# Patient Record
Sex: Female | Born: 1975 | Race: Black or African American | Hispanic: No | Smoking: Former smoker
Health system: Southern US, Community
[De-identification: ages and names within clinical notes are randomized; demographics above are authoritative.]

## PROBLEM LIST (undated history)

## (undated) DIAGNOSIS — I10 Essential (primary) hypertension: Secondary | ICD-10-CM

## (undated) DIAGNOSIS — G8929 Other chronic pain: Secondary | ICD-10-CM

## (undated) DIAGNOSIS — R51 Headache: Secondary | ICD-10-CM

## (undated) DIAGNOSIS — E7800 Pure hypercholesterolemia, unspecified: Secondary | ICD-10-CM

## (undated) DIAGNOSIS — R519 Headache, unspecified: Secondary | ICD-10-CM

## (undated) DIAGNOSIS — F32A Depression, unspecified: Secondary | ICD-10-CM

## (undated) DIAGNOSIS — F419 Anxiety disorder, unspecified: Secondary | ICD-10-CM

## (undated) DIAGNOSIS — K219 Gastro-esophageal reflux disease without esophagitis: Secondary | ICD-10-CM

## (undated) DIAGNOSIS — M549 Dorsalgia, unspecified: Secondary | ICD-10-CM

## (undated) HISTORY — PX: CHOLECYSTECTOMY: SHX55

## (undated) HISTORY — PX: TUBAL LIGATION: SHX77

---

## 2006-10-31 ENCOUNTER — Emergency Department (HOSPITAL_COMMUNITY): Admission: EM | Admit: 2006-10-31 | Discharge: 2006-10-31 | Payer: Self-pay | Admitting: Emergency Medicine

## 2009-01-22 ENCOUNTER — Emergency Department (HOSPITAL_COMMUNITY): Admission: EM | Admit: 2009-01-22 | Discharge: 2009-01-22 | Payer: Self-pay | Admitting: Emergency Medicine

## 2010-11-07 LAB — POCT CARDIAC MARKERS
CKMB, poc: <1 ng/mL — ABNORMAL LOW (ref 1.0–8.0)
Troponin i, poc: <0.05 ng/mL (ref 0.00–0.09)

## 2010-11-07 LAB — DIFFERENTIAL
Eosinophils Relative: 2 % (ref 0–5)
Lymphocytes Relative: 32 % (ref 12–46)
Lymphs Abs: 4.1 10*3/uL — ABNORMAL HIGH (ref 0.7–4.0)
Neutro Abs: 7.9 10*3/uL — ABNORMAL HIGH (ref 1.7–7.7)

## 2010-11-07 LAB — CBC
HCT: 39.7 % (ref 36.0–46.0)
Hemoglobin: 13.5 g/dL (ref 12.0–15.0)
Platelets: 187 10*3/uL (ref 150–400)
WBC: 12.8 10*3/uL — ABNORMAL HIGH (ref 4.0–10.5)

## 2010-11-07 LAB — BASIC METABOLIC PANEL
Calcium: 9.4 mg/dL (ref 8.4–10.5)
GFR calc non Af Amer: >60 mL/min (ref >60)
Potassium: 3.9 mEq/L (ref 3.5–5.1)
Sodium: 137 mEq/L (ref 135–145)

## 2010-11-07 LAB — D-DIMER, QUANTITATIVE: D-Dimer, Quant: 0.22 ug/mL-FEU (ref 0.00–0.48)

## 2011-01-15 ENCOUNTER — Emergency Department (HOSPITAL_COMMUNITY): Payer: Worker's Compensation

## 2011-01-15 ENCOUNTER — Emergency Department (HOSPITAL_COMMUNITY)
Admission: EM | Admit: 2011-01-15 | Discharge: 2011-01-15 | Disposition: A | Discharge status: Home / Self Care | Payer: Worker's Compensation | Attending: Emergency Medicine | Admitting: Emergency Medicine

## 2011-01-15 DIAGNOSIS — X500XXA Overexertion from strenuous movement or load, initial encounter: Secondary | ICD-10-CM | POA: Insufficient documentation

## 2011-01-15 DIAGNOSIS — F17200 Nicotine dependence, unspecified, uncomplicated: Secondary | ICD-10-CM | POA: Insufficient documentation

## 2011-01-15 DIAGNOSIS — Y9210 Unspecified residential institution as the place of occurrence of the external cause: Secondary | ICD-10-CM | POA: Insufficient documentation

## 2011-01-15 DIAGNOSIS — I10 Essential (primary) hypertension: Secondary | ICD-10-CM | POA: Insufficient documentation

## 2011-01-15 DIAGNOSIS — S335XXA Sprain of ligaments of lumbar spine, initial encounter: Secondary | ICD-10-CM | POA: Insufficient documentation

## 2011-02-10 ENCOUNTER — Other Ambulatory Visit (HOSPITAL_COMMUNITY): Payer: Self-pay | Admitting: Preventative Medicine

## 2011-02-10 DIAGNOSIS — M5410 Radiculopathy, site unspecified: Secondary | ICD-10-CM

## 2011-02-14 ENCOUNTER — Ambulatory Visit (HOSPITAL_COMMUNITY)
Admission: RE | Admit: 2011-02-14 | Discharge: 2011-02-14 | Disposition: A | Discharge status: Home / Self Care | Payer: Worker's Compensation | Source: Ambulatory Visit | Attending: Preventative Medicine | Admitting: Preventative Medicine

## 2011-02-14 DIAGNOSIS — M5410 Radiculopathy, site unspecified: Secondary | ICD-10-CM

## 2011-03-01 ENCOUNTER — Other Ambulatory Visit: Payer: Self-pay | Admitting: Preventative Medicine

## 2011-03-01 DIAGNOSIS — IMO0002 Reserved for concepts with insufficient information to code with codable children: Secondary | ICD-10-CM

## 2011-03-02 ENCOUNTER — Other Ambulatory Visit: Payer: Self-pay | Admitting: Preventative Medicine

## 2011-03-02 ENCOUNTER — Ambulatory Visit
Admission: RE | Admit: 2011-03-02 | Discharge: 2011-03-02 | Disposition: A | Discharge status: Home / Self Care | Payer: Worker's Compensation | Source: Ambulatory Visit | Attending: Preventative Medicine | Admitting: Preventative Medicine

## 2011-03-02 DIAGNOSIS — M5126 Other intervertebral disc displacement, lumbar region: Secondary | ICD-10-CM

## 2011-03-02 DIAGNOSIS — IMO0002 Reserved for concepts with insufficient information to code with codable children: Secondary | ICD-10-CM

## 2011-03-02 DIAGNOSIS — M48061 Spinal stenosis, lumbar region without neurogenic claudication: Secondary | ICD-10-CM

## 2011-03-02 MED ORDER — METHYLPREDNISOLONE ACETATE 40 MG/ML INJ SUSP (RADIOLOG
120.0000 mg | Freq: Once | INTRAMUSCULAR | Status: AC
  Administered 2011-03-02: 120 mg via EPIDURAL

## 2011-03-02 MED ORDER — IOHEXOL 180 MG/ML  SOLN
1.0000 mL | Freq: Once | INTRAMUSCULAR | Status: AC | PRN
  Administered 2011-03-02: 1 mL via EPIDURAL

## 2011-03-16 ENCOUNTER — Other Ambulatory Visit: Payer: Self-pay | Admitting: Preventative Medicine

## 2011-03-16 ENCOUNTER — Ambulatory Visit
Admission: RE | Admit: 2011-03-16 | Discharge: 2011-03-16 | Disposition: A | Discharge status: Home / Self Care | Payer: Worker's Compensation | Source: Ambulatory Visit | Attending: Preventative Medicine | Admitting: Preventative Medicine

## 2011-03-16 DIAGNOSIS — M48061 Spinal stenosis, lumbar region without neurogenic claudication: Secondary | ICD-10-CM

## 2011-03-16 DIAGNOSIS — R1030 Lower abdominal pain, unspecified: Secondary | ICD-10-CM

## 2011-03-16 DIAGNOSIS — M5126 Other intervertebral disc displacement, lumbar region: Secondary | ICD-10-CM

## 2011-03-16 MED ORDER — METHYLPREDNISOLONE ACETATE 40 MG/ML INJ SUSP (RADIOLOG
120.0000 mg | Freq: Once | INTRAMUSCULAR | Status: AC
  Administered 2011-03-16: 120 mg via EPIDURAL

## 2011-03-16 MED ORDER — IOHEXOL 180 MG/ML  SOLN
1.0000 mL | Freq: Once | INTRAMUSCULAR | Status: AC | PRN
  Administered 2011-03-16: 1 mL via EPIDURAL

## 2011-03-30 ENCOUNTER — Ambulatory Visit
Admission: RE | Admit: 2011-03-30 | Discharge: 2011-03-30 | Disposition: A | Discharge status: Home / Self Care | Payer: Worker's Compensation | Source: Ambulatory Visit | Attending: Preventative Medicine | Admitting: Preventative Medicine

## 2011-03-30 DIAGNOSIS — R1030 Lower abdominal pain, unspecified: Secondary | ICD-10-CM

## 2012-08-19 ENCOUNTER — Emergency Department (HOSPITAL_COMMUNITY): Payer: PRIVATE HEALTH INSURANCE

## 2012-08-19 ENCOUNTER — Encounter (HOSPITAL_COMMUNITY): Payer: Self-pay | Admitting: *Deleted

## 2012-08-19 ENCOUNTER — Emergency Department (HOSPITAL_COMMUNITY)
Admission: EM | Admit: 2012-08-19 | Discharge: 2012-08-19 | Disposition: A | Discharge status: Home / Self Care | Payer: PRIVATE HEALTH INSURANCE | Attending: Emergency Medicine | Admitting: Emergency Medicine

## 2012-08-19 ENCOUNTER — Other Ambulatory Visit: Payer: Self-pay

## 2012-08-19 DIAGNOSIS — R209 Unspecified disturbances of skin sensation: Secondary | ICD-10-CM | POA: Insufficient documentation

## 2012-08-19 DIAGNOSIS — R42 Dizziness and giddiness: Secondary | ICD-10-CM | POA: Insufficient documentation

## 2012-08-19 DIAGNOSIS — I10 Essential (primary) hypertension: Secondary | ICD-10-CM | POA: Insufficient documentation

## 2012-08-19 DIAGNOSIS — F17200 Nicotine dependence, unspecified, uncomplicated: Secondary | ICD-10-CM | POA: Insufficient documentation

## 2012-08-19 DIAGNOSIS — E7800 Pure hypercholesterolemia, unspecified: Secondary | ICD-10-CM | POA: Insufficient documentation

## 2012-08-19 DIAGNOSIS — R002 Palpitations: Secondary | ICD-10-CM | POA: Insufficient documentation

## 2012-08-19 HISTORY — DX: Pure hypercholesterolemia, unspecified: E78.00

## 2012-08-19 HISTORY — DX: Essential (primary) hypertension: I10

## 2012-08-19 LAB — COMPREHENSIVE METABOLIC PANEL
ALT: 15 U/L (ref 0–35)
AST: 17 U/L (ref 0–37)
Albumin: 4.4 g/dL (ref 3.5–5.2)
Alkaline Phosphatase: 95 U/L (ref 39–117)
Chloride: 98 mEq/L (ref 96–112)
Potassium: 2.9 mEq/L — ABNORMAL LOW (ref 3.5–5.1)
Sodium: 139 mEq/L (ref 135–145)
Total Bilirubin: 0.5 mg/dL (ref 0.3–1.2)
Total Protein: 7.6 g/dL (ref 6.0–8.3)

## 2012-08-19 LAB — CBC WITH DIFFERENTIAL/PLATELET
Basophils Absolute: 0 10*3/uL (ref 0.0–0.1)
Basophils Relative: 0 % (ref 0–1)
Eosinophils Absolute: 0.2 10*3/uL (ref 0.0–0.7)
Hemoglobin: 13.4 g/dL (ref 12.0–15.0)
MCH: 27.3 pg (ref 26.0–34.0)
MCHC: 33.8 g/dL (ref 30.0–36.0)
Monocytes Relative: 5 % (ref 3–12)
Neutro Abs: 5.9 10*3/uL (ref 1.7–7.7)
Neutrophils Relative %: 58 % (ref 43–77)
Platelets: 224 10*3/uL (ref 150–400)
RDW: 13.6 % (ref 11.5–15.5)

## 2012-08-19 LAB — D-DIMER, QUANTITATIVE: D-Dimer, Quant: <0.27 ug/mL-FEU (ref 0.00–0.48)

## 2012-08-19 MED ORDER — POTASSIUM CHLORIDE 20 MEQ/15ML (10%) PO LIQD
40.0000 meq | Freq: Once | ORAL | Status: AC
  Administered 2012-08-19: 40 meq via ORAL
  Filled 2012-08-19: qty 30

## 2012-08-19 MED ORDER — POTASSIUM CHLORIDE CRYS ER 20 MEQ PO TBCR
40.0000 meq | EXTENDED_RELEASE_TABLET | Freq: Once | ORAL | Status: DC
  Filled 2012-08-19: qty 2

## 2012-08-19 MED ORDER — SODIUM CHLORIDE 0.9 % IV SOLN
INTRAVENOUS | Status: DC
  Administered 2012-08-19: 21:00:00 via INTRAVENOUS

## 2012-08-19 NOTE — ED Notes (Signed)
Sudden onset dizziness and shakey  , "numb all over".

## 2012-08-19 NOTE — ED Notes (Signed)
Pt c/o tachycardia and states her "heart was racing" earlier today. Pt states she became weak when symptoms began and had to lay down. Pt states she's had these symptoms previously but has never been seen by MD. Pt denies chest pain.

## 2012-08-19 NOTE — ED Provider Notes (Signed)
History   This chart was scribed for Toy Baker, MD by Charolett Bumpers, ED Scribe. The patient was seen in room [facility]08/[facility]08. Patient's care was started at 2000.   CSN: [id number]  Arrival date & time 08/19/12  7829   First MD Initiated Contact with Patient 08/19/12 2000      Chief Complaint  Patient presents with  . Tachycardia    The history is provided by the patient. No language interpreter was used.   Shirley Thompson is a 37 y.o. female who presents to the Emergency Department complaining of sudden onset of palpitations that started earlier today after waking around 2 pm. She reports associated generalized shakiness, dizziness and facial numbness. She states that the first episode last for 10 minutes. She laid down with improvement of her symptoms, but states the symptoms returned with sitting up. She states that she felt fine this morning after getting off work. She denies any recent illnesses. She denies any vomiting, diarrhea, fever, cough, congestion, SOB, chest pain, abdominal pain, leg swelling or pain. She is currently on her menstrual cycle and denies any chance of pregnancy. She denies any h/o thyroid problems. She states that she has had similar symptoms in the past but has not been evaluated prior to today.   Past Medical History  Diagnosis Date  . Hypertension   . Hypercholesteremia     Past Surgical History  Procedure Date  . Tubal ligation     History reviewed. No pertinent family history.  History  Substance Use Topics  . Smoking status: Current Every Day Smoker    Types: Cigarettes  . Smokeless tobacco: Not on file  . Alcohol Use: No    OB History    Grav Para Term Preterm Abortions TAB SAB Ect Mult Living                  Review of Systems  Constitutional: Negative for fever.  HENT: Negative for congestion.   Respiratory: Negative for cough and shortness of breath.   Cardiovascular: Positive for palpitations. Negative for chest pain and  leg swelling.  Gastrointestinal: Negative for vomiting, abdominal pain and diarrhea.  Neurological: Positive for dizziness and numbness.  All other systems reviewed and are negative.    Allergies  Sulfa antibiotics  Home Medications  No current outpatient prescriptions on file.  BP 126/106  Pulse 66  Temp 97.9 F (36.6 C) (Oral)  Resp 20  Ht 5\' 4"  (1.626 m)  Wt 163 lb 3 oz (74.021 kg)  BMI 28.01 kg/m2  SpO2 100%  LMP 08/13/2012  Physical Exam  Nursing note and vitals reviewed. Constitutional: She is oriented to person, place, and time. She appears well-developed and well-nourished.  Non-toxic appearance. No distress.  HENT:  Head: Normocephalic and atraumatic.  Eyes: Conjunctivae normal, EOM and lids are normal. Pupils are equal, round, and reactive to light.  Neck: Normal range of motion. Neck supple. No tracheal deviation present. No mass present.  Cardiovascular: Normal rate, regular rhythm and normal heart sounds.  Exam reveals no gallop.   No murmur heard. Pulmonary/Chest: Effort normal and breath sounds normal. No stridor. No respiratory distress. She has no decreased breath sounds. She has no wheezes. She has no rhonchi. She has no rales.  Abdominal: Soft. Normal appearance and bowel sounds are normal. She exhibits no distension. There is no tenderness. There is no rebound and no CVA tenderness.  Musculoskeletal: Normal range of motion. She exhibits no edema and no tenderness.  Neurological: She is alert and oriented to person, place, and time. She has normal strength. No cranial nerve deficit or sensory deficit. GCS eye subscore is 4. GCS verbal subscore is 5. GCS motor subscore is 6.       Normal gait. Normal finger to nose test.   Skin: Skin is warm and dry. No abrasion and no rash noted.  Psychiatric: She has a normal mood and affect. Her speech is normal and behavior is normal.    ED Course  Procedures (including critical care time)  DIAGNOSTIC  STUDIES: Oxygen Saturation is 100% on room air, normal by my interpretation.    COORDINATION OF CARE:  20:20-Discussed planned course of treatment with the patient including a chest x-ray and blood work, who is agreeable at this time.    Results for orders placed during the hospital encounter of 08/19/12  D-DIMER, QUANTITATIVE      Component Value Range   D-Dimer, Quant <0.27  0.00 - 0.48 ug/mL-FEU  CBC WITH DIFFERENTIAL      Component Value Range   WBC 10.4  4.0 - 10.5 K/uL   RBC 4.90  3.87 - 5.11 MIL/uL   Hemoglobin 13.4  12.0 - 15.0 g/dL   HCT 09.8  11.9 - 14.7 %   MCV 81.0  78.0 - 100.0 fL   MCH 27.3  26.0 - 34.0 pg   MCHC 33.8  30.0 - 36.0 g/dL   RDW 82.9  56.2 - 13.0 %   Platelets 224  150 - 400 K/uL   Neutrophils Relative 58  43 - 77 %   Neutro Abs 5.9  1.7 - 7.7 K/uL   Lymphocytes Relative 36  12 - 46 %   Lymphs Abs 3.8  0.7 - 4.0 K/uL   Monocytes Relative 5  3 - 12 %   Monocytes Absolute 0.5  0.1 - 1.0 K/uL   Eosinophils Relative 1  0 - 5 %   Eosinophils Absolute 0.2  0.0 - 0.7 K/uL   Basophils Relative 0  0 - 1 %   Basophils Absolute 0.0  0.0 - 0.1 K/uL  COMPREHENSIVE METABOLIC PANEL      Component Value Range   Sodium 139  135 - 145 mEq/L   Potassium 2.9 (*) 3.5 - 5.1 mEq/L   Chloride 98  96 - 112 mEq/L   CO2 31  19 - 32 mEq/L   Glucose, Bld 97  70 - 99 mg/dL   BUN 11  6 - 23 mg/dL   Creatinine, Ser 8.65  0.50 - 1.10 mg/dL   Calcium 9.7  8.4 - 78.4 mg/dL   Total Protein 7.6  6.0 - 8.3 g/dL   Albumin 4.4  3.5 - 5.2 g/dL   AST 17  0 - 37 U/L   ALT 15  0 - 35 U/L   Alkaline Phosphatase 95  39 - 117 U/L   Total Bilirubin 0.5  0.3 - 1.2 mg/dL   GFR calc non Af Amer 90 (*) >90 mL/min   GFR calc Af Amer >90  >90 mL/min    Dg Chest 2 View  08/19/2012  *RADIOLOGY REPORT*  Clinical Data: Chest pain, weakness and dizziness.  CHEST - 2 VIEW  Comparison: PA and lateral chest 12/24/2010.  Findings: Lungs are clear.  Heart size is normal.  No pneumothorax or pleural  fluid.  Thoracolumbar scoliosis noted.  IMPRESSION: No acute disease.   Original Report Authenticated By: Holley Dexter, M.D.      No diagnosis  found.    MDM   Date: 08/19/2012  Rate: 70  Rhythm: normal sinus rhythm  QRS Axis: normal  Intervals: normal  ST/T Wave abnormalities: normal  Conduction Disutrbances:none  Narrative Interpretation:   Old EKG Reviewed: unchanged Patient with mild hypokalemia that was treated with oral potassium. D-dimer negative. Chest x-ray normal. Thyroid function studies pending and she will followup with her Dr.    I personally performed the services described in this documentation, which was scribed in my presence. The recorded information has been reviewed and is accurate.        Toy Baker, MD 08/19/12 2206

## 2012-08-20 LAB — T4: T4, Total: 11.7 ug/dL (ref 5.0–12.5)

## 2012-12-23 ENCOUNTER — Emergency Department (HOSPITAL_COMMUNITY)
Admission: EM | Admit: 2012-12-23 | Discharge: 2012-12-23 | Disposition: A | Discharge status: Home / Self Care | Payer: PRIVATE HEALTH INSURANCE | Attending: Emergency Medicine | Admitting: Emergency Medicine

## 2012-12-23 ENCOUNTER — Encounter (HOSPITAL_COMMUNITY): Payer: Self-pay | Admitting: *Deleted

## 2012-12-23 DIAGNOSIS — R221 Localized swelling, mass and lump, neck: Secondary | ICD-10-CM | POA: Insufficient documentation

## 2012-12-23 DIAGNOSIS — F17200 Nicotine dependence, unspecified, uncomplicated: Secondary | ICD-10-CM | POA: Insufficient documentation

## 2012-12-23 DIAGNOSIS — I10 Essential (primary) hypertension: Secondary | ICD-10-CM | POA: Insufficient documentation

## 2012-12-23 DIAGNOSIS — R51 Headache: Secondary | ICD-10-CM | POA: Insufficient documentation

## 2012-12-23 DIAGNOSIS — Z862 Personal history of diseases of the blood and blood-forming organs and certain disorders involving the immune mechanism: Secondary | ICD-10-CM | POA: Insufficient documentation

## 2012-12-23 DIAGNOSIS — K0889 Other specified disorders of teeth and supporting structures: Secondary | ICD-10-CM

## 2012-12-23 DIAGNOSIS — R220 Localized swelling, mass and lump, head: Secondary | ICD-10-CM | POA: Insufficient documentation

## 2012-12-23 DIAGNOSIS — K089 Disorder of teeth and supporting structures, unspecified: Secondary | ICD-10-CM | POA: Insufficient documentation

## 2012-12-23 DIAGNOSIS — Z79899 Other long term (current) drug therapy: Secondary | ICD-10-CM | POA: Insufficient documentation

## 2012-12-23 DIAGNOSIS — Z8639 Personal history of other endocrine, nutritional and metabolic disease: Secondary | ICD-10-CM | POA: Insufficient documentation

## 2012-12-23 MED ORDER — ACETAMINOPHEN-CODEINE 120-12 MG/5ML PO SUSP: 10.0000 mL | Freq: Four times a day (QID) | ORAL | Status: DC | PRN

## 2012-12-23 MED ORDER — AMOXICILLIN 400 MG/5ML PO SUSR: 400.0000 mg | Freq: Three times a day (TID) | ORAL | Status: AC

## 2012-12-23 MED ORDER — IBUPROFEN 100 MG/5ML PO SUSP
600.0000 mg | Freq: Once | ORAL | Status: AC
  Administered 2012-12-23: 600 mg via ORAL
  Filled 2012-12-23: qty 30

## 2012-12-23 MED ORDER — AMOXICILLIN 250 MG/5ML PO SUSR
500.0000 mg | Freq: Once | ORAL | Status: AC
  Administered 2012-12-23: 500 mg via ORAL
  Filled 2012-12-23: qty 10

## 2012-12-23 MED ORDER — IBUPROFEN 100 MG/5ML PO SUSP
ORAL | Status: AC
  Filled 2012-12-23: qty 30

## 2012-12-23 NOTE — ED Notes (Signed)
Dental pain x 2 days

## 2012-12-23 NOTE — ED Notes (Signed)
Pain rt jaw, says she has dental pain,  Seems very uncomfortable.

## 2012-12-23 NOTE — ED Provider Notes (Signed)
History     CSN: [id number]  Arrival date & time 12/23/12  2058   First MD Initiated Contact with Patient 12/23/12 2137      Chief Complaint  Patient presents with  . Dental Pain    (Consider location/radiation/quality/duration/timing/severity/associated sxs/prior treatment) Patient is a 37 y.o. female presenting with tooth pain. The history is provided by the patient.  Dental Pain Location:  Lower Quality:  Throbbing and radiating Severity:  Severe Onset quality:  Gradual Duration:  2 days Timing:  Intermittent Progression:  Worsening Chronicity:  New Context comment:  Pain of the teeth of the right lower jaw. Relieved by:  Nothing Worsened by:  Nothing tried Ineffective treatments: BC powders. Associated symptoms: facial pain and gum swelling   Associated symptoms: no difficulty swallowing, no fever and no neck pain   Risk factors: smoking     Past Medical History  Diagnosis Date  . Hypertension   . Hypercholesteremia     Past Surgical History  Procedure Laterality Date  . Tubal ligation      No family history on file.  History  Substance Use Topics  . Smoking status: Current Every Day Smoker    Types: Cigarettes  . Smokeless tobacco: Not on file  . Alcohol Use: No    OB History   Grav Para Term Preterm Abortions TAB SAB Ect Mult Living                  Review of Systems  Constitutional: Negative for fever and activity change.       All ROS Neg except as noted in HPI  HENT: Positive for dental problem. Negative for nosebleeds and neck pain.   Eyes: Negative for photophobia and discharge.  Respiratory: Negative for cough, shortness of breath and wheezing.   Cardiovascular: Negative for chest pain and palpitations.  Gastrointestinal: Negative for abdominal pain and blood in stool.  Genitourinary: Negative for dysuria, frequency and hematuria.  Musculoskeletal: Negative for back pain and arthralgias.  Skin: Negative.   Neurological: Negative for  dizziness, seizures and speech difficulty.  Psychiatric/Behavioral: Negative for hallucinations and confusion.    Allergies  Sulfa antibiotics  Home Medications   Current Outpatient Rx  Name  Route  Sig  Dispense  Refill  . Aspirin-Salicylamide-Caffeine (BC HEADACHE) 325-95-16 MG TABS   Oral   Take 1 packet by mouth daily as needed. Once taken as needed for pain         . hydrochlorothiazide (HYDRODIURIL) 25 MG tablet   Oral   Take 25 mg by mouth every morning.         Marland Kitchen acetaminophen-codeine (CAPITAL/CODEINE) 120-12 MG/5ML suspension   Oral   Take 10 mLs by mouth every 6 (six) hours as needed for pain.   120 mL   0   . amoxicillin (AMOXIL) 400 MG/5ML suspension   Oral   Take 5 mLs (400 mg total) by mouth 3 (three) times daily.   100 mL   0     BP 139/93  Pulse 86  Temp(Src) 97.5 F (36.4 C) (Oral)  Resp 20  Ht 5\' 4"  (1.626 m)  Wt 157 lb 4.8 oz (71.351 kg)  BMI 26.99 kg/m2  SpO2 100%  LMP 12/16/2012  Physical Exam  Nursing note and vitals reviewed. Constitutional: She is oriented to person, place, and time. She appears well-developed and well-nourished.  Non-toxic appearance.  HENT:  Head: Normocephalic.  Right Ear: Tympanic membrane and external ear normal.  Left Ear: Tympanic membrane  and external ear normal.  There is pain to palpation of the teeth entire right lower jaw. No visible abscess appreciated. There appears to be an impacted wisdom tooth present. The airway is patent. There is no swelling under the tongue.  Eyes: EOM and lids are normal. Pupils are equal, round, and reactive to light.  Neck: Normal range of motion. Neck supple. Carotid bruit is not present.  Cardiovascular: Normal rate, regular rhythm, normal heart sounds, intact distal pulses and normal pulses.   Pulmonary/Chest: Breath sounds normal. No respiratory distress.  Abdominal: Soft. Bowel sounds are normal. There is no tenderness. There is no guarding.  Musculoskeletal: Normal  range of motion.  Lymphadenopathy:       Head (right side): No submandibular adenopathy present.       Head (left side): No submandibular adenopathy present.    She has no cervical adenopathy.  Neurological: She is alert and oriented to person, place, and time. She has normal strength. No cranial nerve deficit or sensory deficit.  Skin: Skin is warm and dry.  Psychiatric: She has a normal mood and affect. Her speech is normal.    ED Course  Dental Date/Time: 12/23/2012 10:30 PM Performed by: Kathie Dike Authorized by: Kathie Dike Consent: Verbal consent obtained. Risks and benefits: risks, benefits and alternatives were discussed Consent given by: patient Patient understanding: patient states understanding of the procedure being performed Time out: Immediately prior to procedure a "time out" was called to verify the correct patient, procedure, equipment, support staff and site/side marked as required. Local anesthesia used: yes Anesthesia method: dental block. Local anesthetic: bupivacaine 0.25% without epinephrine Patient sedated: no Patient tolerance: Patient tolerated the procedure well with no immediate complications. Comments: Pain much improved after dental block.   (including critical care time)  Labs Reviewed - No data to display No results found.   1. Toothache       MDM  I have reviewed nursing notes, vital signs, and all appropriate lab and imaging results for this patient. Patient presents to the emergency department with right lower jaw dental pain. There is no visible abscess appreciated. The airway is patent. There is no swelling under the tongue.  Patient is treated with a dental block using 0.25% plain bupivacaine.  Prescription for Amoxil suspension and acetaminophen codeine suspension given to the patient. Patient also given a Optician, dispensing. Pain much improved after the dental block.       Kathie Dike, PA-C 12/23/12 2236

## 2012-12-24 NOTE — ED Provider Notes (Signed)
Medical screening examination/treatment/procedure(s) were performed by non-physician practitioner and as supervising physician I was immediately available for consultation/collaboration.   Loren Racer, MD 12/24/12 [phone]

## 2013-08-16 ENCOUNTER — Emergency Department (HOSPITAL_COMMUNITY)
Admission: EM | Admit: 2013-08-16 | Discharge: 2013-08-16 | Disposition: A | Discharge status: Home / Self Care | Payer: PRIVATE HEALTH INSURANCE | Attending: Emergency Medicine | Admitting: Emergency Medicine

## 2013-08-16 ENCOUNTER — Emergency Department (HOSPITAL_COMMUNITY): Payer: PRIVATE HEALTH INSURANCE

## 2013-08-16 ENCOUNTER — Encounter (HOSPITAL_COMMUNITY): Payer: Self-pay | Admitting: Emergency Medicine

## 2013-08-16 DIAGNOSIS — I10 Essential (primary) hypertension: Secondary | ICD-10-CM | POA: Insufficient documentation

## 2013-08-16 DIAGNOSIS — Z862 Personal history of diseases of the blood and blood-forming organs and certain disorders involving the immune mechanism: Secondary | ICD-10-CM | POA: Insufficient documentation

## 2013-08-16 DIAGNOSIS — R110 Nausea: Secondary | ICD-10-CM | POA: Insufficient documentation

## 2013-08-16 DIAGNOSIS — Z8639 Personal history of other endocrine, nutritional and metabolic disease: Secondary | ICD-10-CM | POA: Insufficient documentation

## 2013-08-16 DIAGNOSIS — R1110 Vomiting, unspecified: Secondary | ICD-10-CM | POA: Insufficient documentation

## 2013-08-16 DIAGNOSIS — B9789 Other viral agents as the cause of diseases classified elsewhere: Secondary | ICD-10-CM

## 2013-08-16 DIAGNOSIS — R000 Tachycardia, unspecified: Secondary | ICD-10-CM | POA: Insufficient documentation

## 2013-08-16 DIAGNOSIS — J988 Other specified respiratory disorders: Secondary | ICD-10-CM | POA: Insufficient documentation

## 2013-08-16 DIAGNOSIS — F17200 Nicotine dependence, unspecified, uncomplicated: Secondary | ICD-10-CM | POA: Insufficient documentation

## 2013-08-16 MED ORDER — OSELTAMIVIR PHOSPHATE 75 MG PO CAPS: 75.0000 mg | ORAL_CAPSULE | Freq: Two times a day (BID) | ORAL | Status: DC

## 2013-08-16 MED ORDER — IBUPROFEN 800 MG PO TABS: 800.0000 mg | ORAL_TABLET | Freq: Three times a day (TID) | ORAL | Status: DC

## 2013-08-16 MED ORDER — ALBUTEROL SULFATE (2.5 MG/3ML) 0.083% IN NEBU
5.0000 mg | INHALATION_SOLUTION | Freq: Once | RESPIRATORY_TRACT | Status: AC
  Administered 2013-08-16: 5 mg via RESPIRATORY_TRACT
  Filled 2013-08-16: qty 6

## 2013-08-16 MED ORDER — SODIUM CHLORIDE 0.9 % IV BOLUS (SEPSIS)
1000.0000 mL | Freq: Once | INTRAVENOUS | Status: AC
  Administered 2013-08-16: 1000 mL via INTRAVENOUS

## 2013-08-16 MED ORDER — KETOROLAC TROMETHAMINE 30 MG/ML IJ SOLN
30.0000 mg | Freq: Once | INTRAMUSCULAR | Status: AC
  Administered 2013-08-16: 30 mg via INTRAVENOUS
  Filled 2013-08-16: qty 1

## 2013-08-16 MED ORDER — IBUPROFEN 800 MG PO TABS: 800.0000 mg | ORAL_TABLET | Freq: Once | ORAL | Status: DC

## 2013-08-16 MED ORDER — ONDANSETRON HCL 4 MG PO TABS: 4.0000 mg | ORAL_TABLET | Freq: Four times a day (QID) | ORAL | Status: DC

## 2013-08-16 MED ORDER — ONDANSETRON HCL 4 MG/2ML IJ SOLN
4.0000 mg | Freq: Once | INTRAMUSCULAR | Status: AC
  Administered 2013-08-16: 4 mg via INTRAVENOUS
  Filled 2013-08-16: qty 2

## 2013-08-16 MED ORDER — OSELTAMIVIR PHOSPHATE 75 MG PO CAPS
75.0000 mg | ORAL_CAPSULE | Freq: Once | ORAL | Status: AC
  Administered 2013-08-16: 75 mg via ORAL
  Filled 2013-08-16: qty 1

## 2013-08-16 MED ORDER — ALBUTEROL SULFATE HFA 108 (90 BASE) MCG/ACT IN AERS
2.0000 | INHALATION_SPRAY | RESPIRATORY_TRACT | Status: DC | PRN
  Administered 2013-08-16: 2 via RESPIRATORY_TRACT
  Filled 2013-08-16: qty 6.7

## 2013-08-16 MED ORDER — ACETAMINOPHEN 325 MG PO TABS
650.0000 mg | ORAL_TABLET | Freq: Once | ORAL | Status: AC
  Administered 2013-08-16: 650 mg via ORAL
  Filled 2013-08-16: qty 2

## 2013-08-16 NOTE — ED Notes (Signed)
Fever and body aches for 2 days, severe cough and now with chest discomfort with coughing

## 2013-08-16 NOTE — ED Notes (Signed)
Patient with no complaints at this time. Respirations even and unlabored. Skin warm/dry. Discharge instructions reviewed with patient at this time. Patient given opportunity to voice concerns/ask questions. Patient discharged at this time and left Emergency Department with steady gait.   

## 2013-08-16 NOTE — ED Provider Notes (Signed)
CSN: [id number]     Arrival date & time 08/16/13  0229 History   First MD Initiated Contact with Patient 08/16/13 0309     Chief Complaint  Patient presents with  . Fever  . Generalized Body Aches   (Consider location/radiation/quality/duration/timing/severity/associated sxs/prior Treatment) HPI History provided by patient. Fever, cough, body aches, sore throat, sharp chest pains only with coughing. Feeling sick since yesterday but symptoms became more severe tonight. No productive cough. No abdominal pain. Did have a bout of emesis tonight. Mild nausea at this time. No blood in emesis. No blood in stools. No known sick contacts. Symptoms moderate in severity. Past Medical History  Diagnosis Date  . Hypertension   . Hypercholesteremia    Past Surgical History  Procedure Laterality Date  . Tubal ligation     No family history on file. History  Substance Use Topics  . Smoking status: Current Every Day Smoker    Types: Cigarettes  . Smokeless tobacco: Not on file  . Alcohol Use: No   OB History   Grav Para Term Preterm Abortions TAB SAB Ect Mult Living                 Review of Systems  Constitutional: Positive for fever and chills.  HENT: Positive for congestion and sore throat.   Eyes: Negative for visual disturbance.  Respiratory: Positive for shortness of breath.   Cardiovascular: Positive for chest pain.  Gastrointestinal: Positive for vomiting. Negative for abdominal pain.  Genitourinary: Negative for dysuria.  Musculoskeletal: Negative for neck pain and neck stiffness.  Skin: Negative for rash.  Neurological: Negative for syncope.  All other systems reviewed and are negative.    Allergies  Sulfa antibiotics  Home Medications   Current Outpatient Rx  Name  Route  Sig  Dispense  Refill  . Aspirin-Salicylamide-Caffeine (BC HEADACHE) 325-95-16 MG TABS   Oral   Take 1 packet by mouth daily as needed. Once taken as needed for pain         .  hydrochlorothiazide (HYDRODIURIL) 25 MG tablet   Oral   Take 25 mg by mouth every morning.         Marland Kitchen. ibuprofen (ADVIL,MOTRIN) 100 MG/5ML suspension   Oral   Take 200 mg by mouth every 4 (four) hours as needed.         Marland Kitchen. acetaminophen-codeine (CAPITAL/CODEINE) 120-12 MG/5ML suspension   Oral   Take 10 mLs by mouth every 6 (six) hours as needed for pain.   120 mL   0    BP 134/79  Pulse 111  Temp(Src) 98.2 F (36.8 C) (Oral)  Resp 20  Ht 5\' 4"  (1.626 m)  Wt 170 lb (77.111 kg)  BMI 29.17 kg/m2  SpO2 96%  LMP 08/07/2013 Physical Exam  Constitutional: She is oriented to person, place, and time. She appears well-developed and well-nourished.  HENT:  Head: Normocephalic and atraumatic.  Mouth/Throat: Oropharynx is clear and moist. No oropharyngeal exudate.  Eyes: EOM are normal. Pupils are equal, round, and reactive to light. No scleral icterus.  Neck: Neck supple.  Cardiovascular: Regular rhythm and intact distal pulses.   Tachycardic  Pulmonary/Chest: Effort normal and breath sounds normal. No stridor. No respiratory distress. She has no wheezes. She has no rales.  Abdominal: Soft. Bowel sounds are normal. She exhibits no distension. There is no tenderness.  Musculoskeletal: Normal range of motion. She exhibits no edema and no tenderness.  Neurological: She is alert and oriented to person, place, and  time. No cranial nerve deficit.  Skin: Skin is warm and dry. No rash noted.    ED Course  Procedures (including critical care time) Labs Review Labs Reviewed - No data to display Imaging Review Dg Chest 2 View  08/16/2013   CLINICAL DATA:  Fever and body aches for 2 days. Severe cough and chest discomfort.  EXAM: CHEST  2 VIEW  COMPARISON:  Chest radiograph from 08/14/2013  FINDINGS: The lungs are well-aerated and clear. There is no evidence of focal opacification, pleural effusion or pneumothorax.  The heart is normal in size; the mediastinal contour is within normal  limits. No acute osseous abnormalities are seen.  IMPRESSION: No acute cardiopulmonary process seen.   Electronically Signed   By: Roanna Raider M.D.   On: 08/16/2013 03:33    Treated with IV fluids, IV Zofran, IV Toradol Albuterol nebulized treatment provided  On recheck, nebulizer help her breathing is feeling better after medications and fluids.  Plan discharge home with Tamiflu, albuterol inhaler, Zofran as needed. Patient will take Tylenol and Motrin. Flu precautions provided and verbalized is understood.  MDM  Diagnosis: Viral infection, flulike symptoms  Evaluated with chest x-ray reviewed as above. Improved with medications provided. Serial evaluations performed. Vital signs nurse's notes reviewed and considered   Sunnie Nielsen, MD 08/16/13 [phone]

## 2013-08-16 NOTE — ED Notes (Signed)
Patient w/fever, chills, cough x 2 days. Daughter had flu last week  [facility] Given IVF, Tylenol and Ibuprofen.  Had chest xray done and states they told her "there was something on the right side".  Lungs clear bilaterally throughout.  Last Ibuprofen 20 minutes ago.  Had episode of vomiting.  Denies diarrhea.

## 2013-08-16 NOTE — Discharge Instructions (Signed)
Rest, drink plenty of fluids, take Zofran and Motrin as needed. Use albuterol inhaler as needed. Return to the emergency department for any worsening condition.

## 2014-02-07 ENCOUNTER — Encounter (HOSPITAL_COMMUNITY): Payer: Self-pay | Admitting: Emergency Medicine

## 2014-02-07 ENCOUNTER — Emergency Department (HOSPITAL_COMMUNITY)
Admission: EM | Admit: 2014-02-07 | Discharge: 2014-02-07 | Disposition: A | Discharge status: Home / Self Care | Payer: PRIVATE HEALTH INSURANCE | Attending: Emergency Medicine | Admitting: Emergency Medicine

## 2014-02-07 DIAGNOSIS — R519 Headache, unspecified: Secondary | ICD-10-CM

## 2014-02-07 DIAGNOSIS — F17200 Nicotine dependence, unspecified, uncomplicated: Secondary | ICD-10-CM | POA: Insufficient documentation

## 2014-02-07 DIAGNOSIS — I10 Essential (primary) hypertension: Secondary | ICD-10-CM | POA: Insufficient documentation

## 2014-02-07 DIAGNOSIS — Z79899 Other long term (current) drug therapy: Secondary | ICD-10-CM | POA: Insufficient documentation

## 2014-02-07 DIAGNOSIS — R55 Syncope and collapse: Secondary | ICD-10-CM | POA: Insufficient documentation

## 2014-02-07 DIAGNOSIS — Z8639 Personal history of other endocrine, nutritional and metabolic disease: Secondary | ICD-10-CM | POA: Insufficient documentation

## 2014-02-07 DIAGNOSIS — Z862 Personal history of diseases of the blood and blood-forming organs and certain disorders involving the immune mechanism: Secondary | ICD-10-CM | POA: Insufficient documentation

## 2014-02-07 DIAGNOSIS — R51 Headache: Secondary | ICD-10-CM | POA: Insufficient documentation

## 2014-02-07 DIAGNOSIS — H53149 Visual discomfort, unspecified: Secondary | ICD-10-CM | POA: Insufficient documentation

## 2014-02-07 LAB — TROPONIN I: Troponin I: <0.3 ng/mL (ref <0.30)

## 2014-02-07 LAB — CBC WITH DIFFERENTIAL/PLATELET
BASOS PCT: 0 % (ref 0–1)
Basophils Absolute: 0 10*3/uL (ref 0.0–0.1)
EOS ABS: 0.2 10*3/uL (ref 0.0–0.7)
EOS PCT: 2 % (ref 0–5)
HCT: 39.7 % (ref 36.0–46.0)
HEMOGLOBIN: 13.4 g/dL (ref 12.0–15.0)
LYMPHS ABS: 2.4 10*3/uL (ref 0.7–4.0)
Lymphocytes Relative: 26 % (ref 12–46)
MCH: 27.6 pg (ref 26.0–34.0)
MCHC: 33.8 g/dL (ref 30.0–36.0)
MCV: 81.9 fL (ref 78.0–100.0)
MONO ABS: 0.6 10*3/uL (ref 0.1–1.0)
MONOS PCT: 6 % (ref 3–12)
Neutro Abs: 5.9 10*3/uL (ref 1.7–7.7)
Neutrophils Relative %: 66 % (ref 43–77)
Platelets: 189 10*3/uL (ref 150–400)
RBC: 4.85 MIL/uL (ref 3.87–5.11)
RDW: 13.4 % (ref 11.5–15.5)
WBC: 9 10*3/uL (ref 4.0–10.5)

## 2014-02-07 LAB — URINALYSIS, ROUTINE W REFLEX MICROSCOPIC
BILIRUBIN URINE: NEGATIVE
Glucose, UA: NEGATIVE mg/dL
Ketones, ur: NEGATIVE mg/dL
LEUKOCYTES UA: NEGATIVE
NITRITE: NEGATIVE
Protein, ur: NEGATIVE mg/dL
SPECIFIC GRAVITY, URINE: 1.015 (ref 1.005–1.030)
UROBILINOGEN UA: 1 mg/dL (ref 0.0–1.0)
pH: 8.5 — ABNORMAL HIGH (ref 5.0–8.0)

## 2014-02-07 LAB — COMPREHENSIVE METABOLIC PANEL
ALBUMIN: 3.7 g/dL (ref 3.5–5.2)
ALK PHOS: 81 U/L (ref 39–117)
ALT: 14 U/L (ref 0–35)
ANION GAP: 11 (ref 5–15)
AST: 16 U/L (ref 0–37)
BUN: 9 mg/dL (ref 6–23)
CALCIUM: 9.2 mg/dL (ref 8.4–10.5)
CO2: 28 mEq/L (ref 19–32)
CREATININE: 0.78 mg/dL (ref 0.50–1.10)
Chloride: 102 mEq/L (ref 96–112)
GFR calc non Af Amer: >90 mL/min (ref >90)
GLUCOSE: 109 mg/dL — AB (ref 70–99)
Potassium: 3.6 mEq/L — ABNORMAL LOW (ref 3.7–5.3)
Sodium: 141 mEq/L (ref 137–147)
TOTAL PROTEIN: 6.9 g/dL (ref 6.0–8.3)
Total Bilirubin: 0.4 mg/dL (ref 0.3–1.2)

## 2014-02-07 LAB — URINE MICROSCOPIC-ADD ON

## 2014-02-07 MED ORDER — KETOROLAC TROMETHAMINE 10 MG PO TABS: 10.0000 mg | ORAL_TABLET | Freq: Three times a day (TID) | ORAL | Status: DC

## 2014-02-07 MED ORDER — DEXAMETHASONE SODIUM PHOSPHATE 4 MG/ML IJ SOLN
8.0000 mg | Freq: Once | INTRAMUSCULAR | Status: AC
  Administered 2014-02-07: 8 mg via INTRAVENOUS
  Filled 2014-02-07: qty 2

## 2014-02-07 MED ORDER — SODIUM CHLORIDE 0.9 % IV SOLN
1000.0000 mL | INTRAVENOUS | Status: DC
  Administered 2014-02-07: 1000 mL via INTRAVENOUS

## 2014-02-07 MED ORDER — ONDANSETRON HCL 4 MG/2ML IJ SOLN
4.0000 mg | Freq: Once | INTRAMUSCULAR | Status: AC
  Administered 2014-02-07: 4 mg via INTRAMUSCULAR
  Filled 2014-02-07: qty 2

## 2014-02-07 MED ORDER — GI COCKTAIL ~~LOC~~
30.0000 mL | Freq: Once | ORAL | Status: AC
  Administered 2014-02-07: 30 mL via ORAL
  Filled 2014-02-07: qty 30

## 2014-02-07 MED ORDER — PROMETHAZINE HCL 25 MG RE SUPP: 25.0000 mg | Freq: Four times a day (QID) | RECTAL | Status: DC | PRN

## 2014-02-07 MED ORDER — KETOROLAC TROMETHAMINE 30 MG/ML IJ SOLN
30.0000 mg | Freq: Once | INTRAMUSCULAR | Status: AC
  Administered 2014-02-07: 30 mg via INTRAVENOUS
  Filled 2014-02-07: qty 1

## 2014-02-07 MED ORDER — SODIUM CHLORIDE 0.9 % IV SOLN
1000.0000 mL | Freq: Once | INTRAVENOUS | Status: AC
  Administered 2014-02-07: 1000 mL via INTRAVENOUS

## 2014-02-07 MED ORDER — HYDROCODONE-ACETAMINOPHEN 5-325 MG PO TABS: 1.0000 | ORAL_TABLET | ORAL | Status: DC | PRN

## 2014-02-07 MED ORDER — FAMOTIDINE IN NACL 20-0.9 MG/50ML-% IV SOLN
20.0000 mg | Freq: Once | INTRAVENOUS | Status: AC
  Administered 2014-02-07: 20 mg via INTRAVENOUS
  Filled 2014-02-07: qty 50

## 2014-02-07 NOTE — ED Provider Notes (Signed)
CSN: [id number]     Arrival date & time 02/07/14  1811 History   First MD Initiated Contact with Patient 02/07/14 2026     Chief Complaint  Patient presents with  . Headache  . Nausea     (Consider location/radiation/quality/duration/timing/severity/associated sxs/prior Treatment) Patient is a 38 y.o. female presenting with headaches. The history is provided by the patient.  Headache Pain location:  R parietal Quality: pressure sensation and throbbing. Radiates to:  Does not radiate Severity currently:  6/10 Severity at highest:  9/10 Onset quality:  Gradual Duration:  1 day Timing:  Intermittent Progression:  Worsening Chronicity:  Recurrent Similar to prior headaches: yes   Context comment:  Unknown Relieved by:  Nothing Worsened by:  Nothing tried Associated symptoms: photophobia and tingling   Associated symptoms: no abdominal pain, no back pain, no cough, no dizziness, no hearing loss, no neck pain, no numbness and no seizures   Risk factors: no family hx of SAH     Past Medical History  Diagnosis Date  . Hypertension   . Hypercholesteremia    Past Surgical History  Procedure Laterality Date  . Tubal ligation     No family history on file. History  Substance Use Topics  . Smoking status: Current Every Day Smoker -- 0.50 packs/day    Types: Cigarettes  . Smokeless tobacco: Not on file  . Alcohol Use: No   OB History   Grav Para Term Preterm Abortions TAB SAB Ect Mult Living                 Review of Systems  Constitutional: Negative for activity change.       All ROS Neg except as noted in HPI  HENT: Negative for hearing loss and nosebleeds.   Eyes: Positive for photophobia. Negative for discharge.  Respiratory: Negative for cough, shortness of breath and wheezing.   Cardiovascular: Negative for chest pain and palpitations.  Gastrointestinal: Negative for abdominal pain and blood in stool.  Genitourinary: Negative for dysuria, frequency and hematuria.   Musculoskeletal: Negative for arthralgias, back pain and neck pain.  Skin: Negative.   Neurological: Positive for headaches. Negative for dizziness, seizures, speech difficulty and numbness.  Psychiatric/Behavioral: Negative for hallucinations and confusion.      Allergies  Sulfa antibiotics  Home Medications   Prior to Admission medications   Medication Sig Start Date End Date Taking? Authorizing Provider  acetaminophen-codeine (CAPITAL/CODEINE) 120-12 MG/5ML suspension Take 10 mLs by mouth every 6 (six) hours as needed for pain. 12/23/12   Kathie DikeHobson M Aquila Menzie, PA-C  Aspirin-Salicylamide-Caffeine (BC HEADACHE) 618-371-1673325-95-16 MG TABS Take 1 packet by mouth daily as needed. Once taken as needed for pain    Historical Provider, MD  hydrochlorothiazide (HYDRODIURIL) 25 MG tablet Take 25 mg by mouth every morning.    Historical Provider, MD  ibuprofen (ADVIL,MOTRIN) 100 MG/5ML suspension Take 200 mg by mouth every 4 (four) hours as needed.    Historical Provider, MD  ibuprofen (ADVIL,MOTRIN) 800 MG tablet Take 1 tablet (800 mg total) by mouth 3 (three) times daily. 08/16/13   Sunnie NielsenBrian Opitz, MD  ondansetron (ZOFRAN) 4 MG tablet Take 1 tablet (4 mg total) by mouth every 6 (six) hours. 08/16/13   Sunnie NielsenBrian Opitz, MD  oseltamivir (TAMIFLU) 75 MG capsule Take 1 capsule (75 mg total) by mouth every 12 (twelve) hours. 08/16/13   Sunnie NielsenBrian Opitz, MD   BP 158/82  Pulse 73  Temp(Src) 98.1 F (36.7 C) (Oral)  Resp 15  Ht 5'  4" (1.626 m)  Wt 182 lb (82.555 kg)  BMI 31.22 kg/m2  SpO2 100%  LMP 02/06/2014 Physical Exam  Nursing note and vitals reviewed. Constitutional: She is oriented to person, place, and time. She appears well-developed and well-nourished.  Non-toxic appearance.  HENT:  Head: Normocephalic.  Right Ear: Tympanic membrane and external ear normal.  Left Ear: Tympanic membrane and external ear normal.  Eyes: EOM and lids are normal. Pupils are equal, round, and reactive to light.  Neck: Normal range  of motion. Neck supple. Carotid bruit is not present.  Cardiovascular: Normal rate, regular rhythm, normal heart sounds, intact distal pulses and normal pulses.   Pulmonary/Chest: Breath sounds normal. No respiratory distress.  Abdominal: Soft. Bowel sounds are normal. There is no tenderness. There is no guarding.  Active vomiting during the exam.  Musculoskeletal: Normal range of motion.  Lymphadenopathy:       Head (right side): No submandibular adenopathy present.       Head (left side): No submandibular adenopathy present.    She has no cervical adenopathy.  Neurological: She is alert and oriented to person, place, and time. She has normal strength. No cranial nerve deficit or sensory deficit. She exhibits normal muscle tone. Coordination normal.  Skin: Skin is warm and dry.  Psychiatric: She has a normal mood and affect. Her speech is normal.    ED Course  Procedures (including critical care time) Labs Review Labs Reviewed  COMPREHENSIVE METABOLIC PANEL - Abnormal; Notable for the following:    Potassium 3.6 (*)    Glucose, Bld 109 (*)    All other components within normal limits  CBC WITH DIFFERENTIAL  URINALYSIS, ROUTINE W REFLEX MICROSCOPIC    Imaging Review No results found.   EKG Interpretation None      MDM  Pt feels much better after IV fluids, IV zofran, and GI cocktail. Headache resolved, and burning sensation resolved. EKG and Troponin non-acute. No gross neuro deficit appreciated. Rx for toradol and norco for headache. Promethazine for nausea/vomiting.   Final diagnoses:  None    *I have reviewed nursing notes, vital signs, and all appropriate lab and imaging results for this patient.Kathie Dike, PA-C 02/08/14 0100

## 2014-02-07 NOTE — ED Notes (Signed)
PT c/o nausea with vomiting x1 day with constant right sided pressure headache. PT stated she was outside all day yesterday.

## 2014-02-07 NOTE — Discharge Instructions (Signed)
Please increase fluids. Use Toradol for mild to mod headache, use norco for more severe headache. Use promethazine suppositories for nausea or vomiting. Promethazine and norco may cause drowsiness, use with caution. Pepcid 20 mg 2 time daily may also be helpful.

## 2014-02-08 NOTE — ED Provider Notes (Signed)
Medical screening examination/treatment/procedure(s) were performed by non-physician practitioner and as supervising physician I was immediately available for consultation/collaboration.   EKG Interpretation None        Taiven Greenley L Thersia Petraglia, MD 02/08/14 1454 

## 2014-07-23 ENCOUNTER — Emergency Department (HOSPITAL_COMMUNITY)
Admission: EM | Admit: 2014-07-23 | Discharge: 2014-07-23 | Disposition: A | Discharge status: Home / Self Care | Payer: PRIVATE HEALTH INSURANCE | Attending: Emergency Medicine | Admitting: Emergency Medicine

## 2014-07-23 ENCOUNTER — Emergency Department (HOSPITAL_COMMUNITY): Payer: PRIVATE HEALTH INSURANCE

## 2014-07-23 ENCOUNTER — Encounter (HOSPITAL_COMMUNITY): Payer: Self-pay | Admitting: Emergency Medicine

## 2014-07-23 DIAGNOSIS — I10 Essential (primary) hypertension: Secondary | ICD-10-CM | POA: Diagnosis not present

## 2014-07-23 DIAGNOSIS — Z720 Tobacco use: Secondary | ICD-10-CM | POA: Insufficient documentation

## 2014-07-23 DIAGNOSIS — H81399 Other peripheral vertigo, unspecified ear: Secondary | ICD-10-CM

## 2014-07-23 DIAGNOSIS — Z8639 Personal history of other endocrine, nutritional and metabolic disease: Secondary | ICD-10-CM | POA: Diagnosis not present

## 2014-07-23 DIAGNOSIS — R42 Dizziness and giddiness: Secondary | ICD-10-CM | POA: Diagnosis present

## 2014-07-23 DIAGNOSIS — G8929 Other chronic pain: Secondary | ICD-10-CM | POA: Insufficient documentation

## 2014-07-23 DIAGNOSIS — Z79899 Other long term (current) drug therapy: Secondary | ICD-10-CM | POA: Diagnosis not present

## 2014-07-23 HISTORY — DX: Other chronic pain: G89.29

## 2014-07-23 HISTORY — DX: Headache: R51

## 2014-07-23 HISTORY — DX: Dorsalgia, unspecified: M54.9

## 2014-07-23 HISTORY — DX: Headache, unspecified: R51.9

## 2014-07-23 LAB — I-STAT CHEM 8, ED
BUN: 9 mg/dL (ref 6–23)
CALCIUM ION: 1.19 mmol/L (ref 1.12–1.23)
CREATININE: 0.8 mg/dL (ref 0.50–1.10)
Chloride: 105 mEq/L (ref 96–112)
GLUCOSE: 97 mg/dL (ref 70–99)
HCT: 39 % (ref 36.0–46.0)
HEMOGLOBIN: 13.3 g/dL (ref 12.0–15.0)
Potassium: 4 mmol/L (ref 3.5–5.1)
Sodium: 139 mmol/L (ref 135–145)
TCO2: 21 mmol/L (ref 0–100)

## 2014-07-23 MED ORDER — MECLIZINE HCL 12.5 MG PO TABS
25.0000 mg | ORAL_TABLET | Freq: Once | ORAL | Status: AC
  Administered 2014-07-23: 25 mg via ORAL
  Filled 2014-07-23: qty 2

## 2014-07-23 MED ORDER — ONDANSETRON 4 MG PO TBDP: 4.0000 mg | ORAL_TABLET | Freq: Three times a day (TID) | ORAL | Status: AC | PRN

## 2014-07-23 MED ORDER — MECLIZINE HCL 25 MG PO TABS: 25.0000 mg | ORAL_TABLET | Freq: Three times a day (TID) | ORAL | Status: DC | PRN

## 2014-07-23 NOTE — ED Provider Notes (Signed)
CSN: [id number]     Arrival date & time 07/23/14  [id number] History   First MD Initiated Contact with Patient 07/23/14 0747     Chief Complaint  Patient presents with  . Dizziness      HPI Pt was seen at 0755 Per pt, c/o sudden onset and resolution of multiple intermittent episodes of "dizziness" for the past 2 weeks. Pt describes the dizziness as "everything is moving around," worsens with movement of her head side-to-side as well as walking and body position changes. Symptoms improve with laying still. Has been associated with sinus and ears congestion. Denies CP/palpitations, no SOB/cough, no abd pain, no N/V/D, no focal motor weakness, no tingling/numbness in extremities, no facial droop, no slurred speech, no dysphagia.    Past Medical History  Diagnosis Date  . Hypertension   . Hypercholesteremia   . Chronic back pain   . Headache    Past Surgical History  Procedure Laterality Date  . Tubal ligation      History  Substance Use Topics  . Smoking status: Current Every Day Smoker -- 0.50 packs/day    Types: Cigarettes  . Smokeless tobacco: Not on file  . Alcohol Use: No    Review of Systems ROS: Statement: All systems negative except as marked or noted in the HPI; Constitutional: Negative for fever and chills. ; ; Eyes: Negative for eye pain, redness and discharge. ; ; ENMT: Negative for ear pain, hoarseness, sore throat. +nasal congestion, sinus pressure. ; ; Cardiovascular: Negative for chest pain, palpitations, diaphoresis, dyspnea and peripheral edema. ; ; Respiratory: Negative for cough, wheezing and stridor. ; ; Gastrointestinal: Negative for nausea, vomiting, diarrhea, abdominal pain, blood in stool, hematemesis, jaundice and rectal bleeding. . ; ; Genitourinary: Negative for dysuria, flank pain and hematuria. ; ; Musculoskeletal: Negative for back pain and neck pain. Negative for swelling and trauma.; ; Skin: Negative for pruritus, rash, abrasions, blisters, bruising and skin  lesion.; ; Neuro: +"dizziness." Negative for headache, lightheadedness and neck stiffness. Negative for weakness, altered level of consciousness , altered mental status, extremity weakness, paresthesias, involuntary movement, seizure and syncope.     Allergies  Sulfa antibiotics  Home Medications   Prior to Admission medications   Medication Sig Start Date End Date Taking? Authorizing Provider  hydrochlorothiazide (HYDRODIURIL) 25 MG tablet Take 25 mg by mouth every morning.   Yes Historical Provider, MD  Aspirin-Salicylamide-Caffeine (BC HEADACHE) 325-95-16 MG TABS Take 1 packet by mouth daily as needed. Once taken as needed for pain    Historical Provider, MD  HYDROcodone-acetaminophen (NORCO/VICODIN) 5-325 MG per tablet Take 1 tablet by mouth every 4 (four) hours as needed. Patient not taking: Reported on 07/23/2014 02/07/14   Kathie DikeHobson M Bryant, PA-C  ketorolac (TORADOL) 10 MG tablet Take 1 tablet (10 mg total) by mouth 3 (three) times daily. Patient not taking: Reported on 07/23/2014 02/07/14   Kathie DikeHobson M Bryant, PA-C  promethazine (PHENERGAN) 25 MG suppository Place 1 suppository (25 mg total) rectally every 6 (six) hours as needed for nausea or vomiting. Patient not taking: Reported on 07/23/2014 02/07/14   Kathie DikeHobson M Bryant, PA-C   BP 137/79 mmHg  Pulse 86  Temp(Src) 98.2 F (36.8 C)  Resp 18  Ht 5\' 4"  (1.626 m)  Wt 180 lb (81.647 kg)  BMI 30.88 kg/m2  SpO2 100%  LMP 07/06/2014 Physical Exam  0800: Physical examination:  Nursing notes reviewed; Vital signs and O2 SAT reviewed;  Constitutional: Well developed, Well nourished, Well hydrated, In no  acute distress; Head:  Normocephalic, atraumatic; Eyes: EOMI, PERRL, No scleral icterus; ENMT: TM's clear bilat. +edemetous nasal turbinates bilat with clear rhinorrhea. Mouth and pharynx normal, Mucous membranes moist; Neck: Supple, Full range of motion, No lymphadenopathy; Cardiovascular: Regular rate and rhythm, No murmur, rub, or gallop;  Respiratory: Breath sounds clear & equal bilaterally, No rales, rhonchi, wheezes.  Speaking full sentences with ease, Normal respiratory effort/excursion; Chest: Nontender, Movement normal; Abdomen: Soft, Nontender, Nondistended, Normal bowel sounds; Genitourinary: No CVA tenderness; Extremities: Pulses normal, No tenderness, No edema, No calf edema or asymmetry.; Neuro: AA&Ox3, Major CN grossly intact.Speech clear.  No facial droop.  +right horizontal end gaze fatigable nystagmus which reproduces pt's symptoms. Grips equal. Strength 5/5 equal bilat UE's and LE's.  DTR 2/4 equal bilat UE's and LE's.  No gross sensory deficits.  Normal cerebellar testing bilat UE's (finger-nose) and LE's (heel-shin)..; Skin: Color normal, Warm, Dry.   ED Course  Procedures     EKG Interpretation   Date/Time:  Thursday July 23 2014 07:46:02 EST Ventricular Rate:  74 PR Interval:  145 QRS Duration: 83 QT Interval:  371 QTC Calculation: 412 R Axis:   52 Text Interpretation:  Sinus rhythm Normal ECG When compared with ECG of  02/07/2014 No significant change was found Confirmed by [facility]  MD,  Nicholos JohnsKATHLEEN [phone]([id number]) on 07/23/2014 8:08:47 AM      MDM  MDM Reviewed: previous chart, nursing note and vitals Reviewed previous: labs and ECG Interpretation: labs, ECG and CT scan      Results for orders placed or performed during the hospital encounter of 07/23/14  I-stat Chem 8, ED  Result Value Ref Range   Sodium 139 135 - 145 mmol/L   Potassium 4.0 3.5 - 5.1 mmol/L   Chloride 105 96 - 112 mEq/L   BUN 9 6 - 23 mg/dL   Creatinine, Ser 1.910.80 0.50 - 1.10 mg/dL   Glucose, Bld 97 70 - 99 mg/dL   Calcium, Ion 4.781.19 2.951.12 - 1.23 mmol/L   TCO2 21 0 - 100 mmol/L   Hemoglobin 13.3 12.0 - 15.0 g/dL   HCT 62.139.0 30.836.0 - 65.746.0 %   Ct Head Wo Contrast 07/23/2014   CLINICAL DATA:  Two week history of dizziness  EXAM: CT HEAD WITHOUT CONTRAST  TECHNIQUE: Contiguous axial images were obtained from the base of the skull  through the vertex without intravenous contrast.  COMPARISON:  None.  FINDINGS: The ventricles are normal in size and configuration. There is no mass hemorrhage, extra-axial fluid collection, or midline shift. Gray-white compartments are normal. No acute infarct apparent. Bony calvarium appears intact. Visualized mastoid air cells are clear. There is opacification of the right sphenoid sinus.  IMPRESSION: Right sphenoid sinus opacification. No intracranial mass, hemorrhage, or focal gray -white compartment lesions/acute appearing infarct.   Electronically Signed   By: Bretta BangWilliam  Woodruff M.D.   On: 07/23/2014 08:26    0940:  Pt not orthostatic on VS. Pt ambulated with steady gait. States she feels "better" after meds and wants to go home now. Dx and testing d/w pt and family.  Questions answered.  Verb understanding, agreeable to d/c home with outpt f/u.   Samuel JesterKathleen Hulan Szumski, DO 07/26/14 1012

## 2014-07-23 NOTE — ED Notes (Signed)
Pt ambulated independently around nurse's station with standby assist. Steady gait. C/o slight dizziness but states feels better than when she arrived. nad noted.

## 2014-07-23 NOTE — Discharge Instructions (Signed)
°Emergency Department Resource Guide °1) Find a Doctor and Pay Out of Pocket °Although you won't have to find out who is covered by your insurance plan, it is a good idea to ask around and get recommendations. You will then need to call the office and see if the doctor you have chosen will accept you as a new patient and what types of options they offer for patients who are self-pay. Some doctors offer discounts or will set up payment plans for their patients who do not have insurance, but you will need to ask so you aren't surprised when you get to your appointment. ° °2) Contact Your Local Health Department °Not all health departments have doctors that can see patients for sick visits, but many do, so it is worth a call to see if yours does. If you don't know where your local health department is, you can check in your phone book. The CDC also has a tool to help you locate your state's health department, and many state websites also have listings of all of their local health departments. ° °3) Find a Walk-in Clinic °If your illness is not likely to be very severe or complicated, you may want to try a walk in clinic. These are popping up all over the country in pharmacies, drugstores, and shopping centers. They're usually staffed by nurse practitioners or physician assistants that have been trained to treat common illnesses and complaints. They're usually fairly quick and inexpensive. However, if you have serious medical issues or chronic medical problems, these are probably not your best option. ° °No Primary Care Doctor: °- Call [facility] at  832-8000 - they can help you locate a primary care doctor that  accepts your insurance, provides certain services, etc. °- Physician Referral Service- 1-800-533-3463 ° °Chronic Pain Problems: °Organization         Address  Phone   Notes  °[facility]  (336) 297-2271 Patients need to be referred by their primary care doctor.  ° °Medication  Assistance: °Organization         Address  Phone   Notes  °[place] [facility] [address] °[place], [place] [address] (336) 641-8030 --Must be a resident of [place] °-- Must have NO insurance coverage whatsoever (no Medicaid/ Medicare, etc.) °-- The pt. MUST have a primary care doctor that directs their care regularly and follows them in the community °  °[facility]  (866) 331-1348   °[employer]  (888) 892-1162   ° °Agencies that provide inexpensive medical care: °Organization         Address  Phone   Notes  °[facility]  (336) 832-8035   °[facility]    (336) 832-7272   °[facility] [address] °[place], [place] [address] (336) 832-4777   °[facility] [address], °[place] (336) 271-4999   °[facility]    (336) 373-0678   °[facility]    (336) 272-1050   °[facility] ° [address], [place] Phone:  (336) 832-4444, Fax:  (336) 832-4440 Hours of Operation:  9 am - 6 pm, M-F.  Also accepts Medicaid/Medicare and self-pay.  °[facility] ° [address], [place] Phone: (336) 832-3150, Fax: (336) [phone]. Hours of Operation:  8:30 am - 5:30 pm, M-F.  Also accepts Medicaid and self-pay.  °[facility] [place] [address], [place] Phone: (336) 878-6027   °[facility] [address], [place], [place] (336)723-1848, Ext. 123 Mondays & Thursdays: 7-9 AM.  First 15 patients are seen on a first come, first serve basis. °  ° °Medicaid-accepting [place] Providers: ° °Organization         Address  Phone   Notes  °[facility] [address], [place] (336) 641-2100 Also accepts self-pay patients.  °[facility] [address], [place] ° (336) 856-9996   °[facility] [address], [place]  (336) 288-8857   °[facility] [address] [place] [address], [place] (336) 299-7000   °Veita Bland [address], [place]  ° (336) 373-1557 Only accepts [place] Access Medicaid patients after they have their name applied to their card.  ° °Self-Pay (no insurance) in [place]: ° °Organization         Address  Phone   Notes  °Sickle Cell Patients, [facility] [address], [place] (336) 832-1970   °[facility] [address], [place] (336) 832-4400   °[facility] ° [address], [place] (336) 992-4800   °[facility]/Dr. Osei-Bonsu ° [address] [place] [address], [place] or [address], [place] (336) 841-8500 Phone number for both [place] and [place] locations is the same.  °[facility] [address], [place] (336) 299-0000   °[facility] [address] [place] [address], [place] or [address] (336) 852-7530 °(336) 878-2260   °[facility] [address], [place] (336) 350-1642, phone; (336) 294-5005, fax Sees patients 1st and 3rd Saturday of every month.  Must not qualify for public or private insurance (i.e. Medicaid, Medicare, [place] Health Choice, Veterans' Benefits) • Household income should be no more than 200% of the poverty level •The clinic cannot treat you if you are pregnant or think you are pregnant • Sexually transmitted diseases are not treated at the clinic.  ° ° °Dental Care: °Organization         Address  Phone  Notes  °[place] [facility] [address], [place] (336) 641-6152 Accepts children up to age 21 who are enrolled in Medicaid or [place] Health Choice; pregnant women with a Medicaid card; and children who have applied for Medicaid or [place] Health Choice, but were declined, whose parents can pay a reduced fee at time of service.  °[place]  [facility] [place]  [address], [place] (336) 641-7733 Accepts children up to age 21 who are enrolled in Medicaid or [place] Health Choice; pregnant women with a Medicaid card; and children who have applied for Medicaid or [place] Health Choice, but were declined, whose parents can pay a reduced fee at time of service.  °[facility] ° [address], [place] (336) 641-4533 Patients are seen by appointment only. Walk-ins are not accepted. [facility] will see patients 18 years of age and older. °Monday - Tuesday (8am-5pm) °Most Wednesdays (8:30-5pm) °$30 per visit, cash only  °[facility] ° [address], [place] (336) 641-4533 Patients are seen by appointment only. Walk-ins are not accepted. [facility] will see patients 18 years of age and older. °One   Wednesday Evening (Monthly: Volunteer Based).  $30 per visit, cash only  °[facility]  (919) 537-3737 for adults; Children under age 4, call Graduate Pediatric Dentistry at (919) [phone]. Children aged 4-14, please call (919) [phone] to request a pediatric application. ° Dental services are provided in all areas of dental care including fillings, crowns and bridges, complete and partial dentures, implants, gum treatment, root canals, and extractions. Preventive care is also provided. Treatment is provided to both adults and children. °Patients are selected via a lottery and there is often a waiting list. °  °[facility] [address], °[place] ° (336) 763-8833 [email or web address] °  °[facility] [address], [place], [place] (336)723-1848, Ext. 123 Second and Fourth Thursday of each month, opens at 6:30 AM; Clinic ends at 9 AM.  Patients are seen on a first-come first-served basis, and a limited number are seen during each clinic.  ° °[facility] ° [address], [place], [place] (336) 723-7904    Eligibility Requirements °You must have lived in [place], [place], or [place] counties for at least the last three months. °  You cannot be eligible for state or federal sponsored healthcare insurance, including Veterans Administration, Medicaid, or Medicare. °  You generally cannot be eligible for healthcare insurance through your employer.  °  How to apply: °Eligibility screenings are held every Tuesday and Wednesday afternoon from 1:00 pm until 4:00 pm. You do not need an appointment for the interview!  °[facility] [address], [place], [place] 336-631-2330   °[facility]  336-342-8273   °[facility]  336-703-3100   °[facility]  336-570-6415   ° °Behavioral Health Resources in the Community: °Intensive Outpatient Programs °Organization         Address  Phone  Notes  °[facility] [address], [place], [place] 336-878-6098   °[facility] [address], [place], [place] 336-832-9800   °[facility] [address], [place], [place] ° 336-882-2125   °[facility] [address],  °[place], [place] 1-800-853-5163 or 336-641-4981   °Substance Abuse Resources °Organization         Address  Phone  Notes  °[facility]  336-882-2125   °[facility]  336-784-9470   °[facility]  336-285-9073   °[facility]  336-845-3988   °Residential & Outpatient Substance Abuse Program  1-800-659-3381   °Psychological Services °Organization         Address  Phone  Notes  °[facility]  336- 832-9600   °[facility]  336- 378-7881   °[facility] [address], [place] 1-800-853-5163 or 336-641-4981   ° °Mobile Crisis Teams °Organization         Address  Phone  Notes  °[facility], Mobile Crisis Care Unit  1-877-626-1772   °Assertive °[facility] ° [address]  [place], [place] 336-834-9664   °Sharon DeEsch [address] °[place] 336-554-5454   ° °Self-Help/Support Groups °Organization         Address  Phone             Notes  °[facility]  - variety of support groups  336- 373-1402 Call for more information  °Narcotics Anonymous (NA), [facility] [address], °[place]  2 meetings at this location  ° °  Residential Treatment Programs Organization         Address  Phone  Notes  [facility] [address],    [place]  [phone]   [facility]  [address], [place] [phone], [place], [place] [phone]) [phone]   [facility] [address], [place] [phone] Admissions: 8am-3pm M-F  [facility] [address]    [place], [place] [phone]   [facility] [address], [place], [place] [phone]   [facility] [address]  [place], [place] [phone]   [facility] Intensive Outpatient [address], [place], [place] [phone]   [facility] [address]  [place], [place] [phone] or [phone](587)[phone]   [facility] [address] [place], [place] [phone] Accepts Medicaid  [facility] [address]  [place] [phone]) [phone] Substance Abuse/Addiction Treatment   [facility]         Address  Phone  Notes  [facility]  579-316-9252(888) 5171489377   Angie FavaJulie Brannon, PhD [address], [address] [place], [place]   [phone](336) 831-874-2179 or [phone](336) [phone]   [facility]   [address] [place], [place] ([phone]) [phone](336) [phone]   [facility] [address], [place], [place] (234)[phone](336) (213)[phone] Insurance/Medicaid/sponsorship through [facility] [address]                                    [place], [place] ([phone](336) ([phone] Therapy/tele-psych/case    [facility] [address]   [place], [place] [phone](336) [phone]    Dr. Lolly MustacheArfeen  [phone](336) [phone]   [facility] [place] [facility]  [facility] 1) [address], [place] 2) [address], [place] 3)  [address], [place] [phone](336) [phone](336) [phone]  [phone](336) [phone]   [facility] Child [facility] (704) 868-5581(336) (346)144-9627 or 936-709-2823(336) (323)[phone] (After Hours)      Take the prescriptions as directed.  Call your regular medical doctor and the ENT doctor today to schedule a follow up appointment within the next week.  Return to the Emergency Department immediately sooner if worsening.

## 2014-07-23 NOTE — ED Notes (Signed)
Pt c/o intermittent dizziness x 2 weeks worse since 0100 this am.

## 2015-10-04 ENCOUNTER — Emergency Department (HOSPITAL_COMMUNITY): Payer: PRIVATE HEALTH INSURANCE

## 2015-10-04 ENCOUNTER — Emergency Department (HOSPITAL_COMMUNITY)
Admission: EM | Admit: 2015-10-04 | Discharge: 2015-10-04 | Disposition: A | Discharge status: Home / Self Care | Payer: PRIVATE HEALTH INSURANCE | Attending: Emergency Medicine | Admitting: Emergency Medicine

## 2015-10-04 ENCOUNTER — Encounter (HOSPITAL_COMMUNITY): Payer: Self-pay | Admitting: *Deleted

## 2015-10-04 DIAGNOSIS — R1011 Right upper quadrant pain: Secondary | ICD-10-CM | POA: Diagnosis not present

## 2015-10-04 DIAGNOSIS — G8929 Other chronic pain: Secondary | ICD-10-CM | POA: Diagnosis not present

## 2015-10-04 DIAGNOSIS — M549 Dorsalgia, unspecified: Secondary | ICD-10-CM | POA: Diagnosis not present

## 2015-10-04 DIAGNOSIS — E7800 Pure hypercholesterolemia, unspecified: Secondary | ICD-10-CM | POA: Diagnosis not present

## 2015-10-04 DIAGNOSIS — F17210 Nicotine dependence, cigarettes, uncomplicated: Secondary | ICD-10-CM | POA: Diagnosis not present

## 2015-10-04 DIAGNOSIS — I10 Essential (primary) hypertension: Secondary | ICD-10-CM | POA: Insufficient documentation

## 2015-10-04 DIAGNOSIS — R109 Unspecified abdominal pain: Secondary | ICD-10-CM | POA: Diagnosis present

## 2015-10-04 DIAGNOSIS — R1010 Upper abdominal pain, unspecified: Secondary | ICD-10-CM

## 2015-10-04 LAB — COMPREHENSIVE METABOLIC PANEL
ALT: 13 U/L — AB (ref 14–54)
AST: 15 U/L (ref 15–41)
Albumin: 4.2 g/dL (ref 3.5–5.0)
Alkaline Phosphatase: 80 U/L (ref 38–126)
Anion gap: 6 (ref 5–15)
BUN: 13 mg/dL (ref 6–20)
CHLORIDE: 102 mmol/L (ref 101–111)
CO2: 31 mmol/L (ref 22–32)
Calcium: 9 mg/dL (ref 8.9–10.3)
Creatinine, Ser: 0.84 mg/dL (ref 0.44–1.00)
GFR calc non Af Amer: >60 mL/min (ref >60)
Glucose, Bld: 100 mg/dL — ABNORMAL HIGH (ref 65–99)
Potassium: 3.8 mmol/L (ref 3.5–5.1)
Sodium: 139 mmol/L (ref 135–145)
Total Bilirubin: 0.7 mg/dL (ref 0.3–1.2)
Total Protein: 7.2 g/dL (ref 6.5–8.1)

## 2015-10-04 LAB — URINALYSIS, ROUTINE W REFLEX MICROSCOPIC
Bilirubin Urine: NEGATIVE
Glucose, UA: NEGATIVE mg/dL
Ketones, ur: NEGATIVE mg/dL
LEUKOCYTES UA: NEGATIVE
Nitrite: NEGATIVE
PROTEIN: NEGATIVE mg/dL
Specific Gravity, Urine: 1.02 (ref 1.005–1.030)
pH: 6 (ref 5.0–8.0)

## 2015-10-04 LAB — CBC WITH DIFFERENTIAL/PLATELET
Basophils Absolute: 0 10*3/uL (ref 0.0–0.1)
Basophils Relative: 0 %
EOS PCT: 2 %
Eosinophils Absolute: 0.2 10*3/uL (ref 0.0–0.7)
HCT: 41.4 % (ref 36.0–46.0)
Hemoglobin: 13.8 g/dL (ref 12.0–15.0)
LYMPHS PCT: 39 %
Lymphs Abs: 4.4 10*3/uL — ABNORMAL HIGH (ref 0.7–4.0)
MCH: 27.8 pg (ref 26.0–34.0)
MCHC: 33.3 g/dL (ref 30.0–36.0)
MCV: 83.3 fL (ref 78.0–100.0)
MONO ABS: 0.7 10*3/uL (ref 0.1–1.0)
Monocytes Relative: 6 %
Neutro Abs: 5.9 10*3/uL (ref 1.7–7.7)
Neutrophils Relative %: 53 %
PLATELETS: 234 10*3/uL (ref 150–400)
RBC: 4.97 MIL/uL (ref 3.87–5.11)
RDW: 13.9 % (ref 11.5–15.5)
WBC: 11.2 10*3/uL — AB (ref 4.0–10.5)

## 2015-10-04 LAB — LIPASE, BLOOD: LIPASE: 25 U/L (ref 11–51)

## 2015-10-04 LAB — URINE MICROSCOPIC-ADD ON: WBC UA: NONE SEEN WBC/hpf (ref 0–5)

## 2015-10-04 LAB — PREGNANCY, URINE: PREG TEST UR: NEGATIVE

## 2015-10-04 MED ORDER — ONDANSETRON HCL 4 MG/2ML IJ SOLN
4.0000 mg | Freq: Once | INTRAMUSCULAR | Status: AC
  Administered 2015-10-04: 4 mg via INTRAVENOUS
  Filled 2015-10-04: qty 2

## 2015-10-04 MED ORDER — PANTOPRAZOLE SODIUM 40 MG IV SOLR
40.0000 mg | Freq: Once | INTRAVENOUS | Status: AC
  Administered 2015-10-04: 40 mg via INTRAVENOUS
  Filled 2015-10-04: qty 40

## 2015-10-04 MED ORDER — DICYCLOMINE HCL 10 MG PO CAPS
10.0000 mg | ORAL_CAPSULE | Freq: Once | ORAL | Status: AC
  Administered 2015-10-04: 10 mg via ORAL
  Filled 2015-10-04: qty 1

## 2015-10-04 MED ORDER — HYDROCODONE-ACETAMINOPHEN 5-325 MG PO TABS: 1.0000 | ORAL_TABLET | Freq: Four times a day (QID) | ORAL | Status: DC | PRN

## 2015-10-04 MED ORDER — DICYCLOMINE HCL 20 MG PO TABS: 20.0000 mg | ORAL_TABLET | Freq: Two times a day (BID) | ORAL | Status: DC

## 2015-10-04 MED ORDER — SODIUM CHLORIDE 0.9 % IV BOLUS (SEPSIS)
1000.0000 mL | Freq: Once | INTRAVENOUS | Status: AC
  Administered 2015-10-04: 1000 mL via INTRAVENOUS

## 2015-10-04 MED ORDER — MORPHINE SULFATE (PF) 4 MG/ML IV SOLN
4.0000 mg | Freq: Once | INTRAVENOUS | Status: AC
Start: 2015-10-04 — End: 2015-10-04
  Administered 2015-10-04: 4 mg via INTRAVENOUS
  Filled 2015-10-04: qty 1

## 2015-10-04 NOTE — ED Notes (Signed)
Pt c/o severe abdominal pain that started at 4am today

## 2015-10-04 NOTE — ED Provider Notes (Signed)
CSN: [id number]     Arrival date & time 10/04/15  0433 History   First MD Initiated Contact with Patient 10/04/15 0502     Chief Complaint  Patient presents with  . Abdominal Pain     (Consider location/radiation/quality/duration/timing/severity/associated sxs/prior Treatment) HPI  This is a 40 year old female with a history of hypertension, hypercholesterolemia, and chronic back pain who presents with abdominal pain and right flank pain. Patient reports onset of symptoms this morning at 4 AM. Symptoms were acute in onset. She reports crampy upper abdominal pain that radiates into her right back.  Denies any vomiting or diarrhea. States that laying on one side seems to help her. Current pain is 8 out of 10. She's not taken anything for the pain. She denies any hematuria or dysuria. Denies any history of kidney stones. She is status post cholecystectomy. She is currently on her period.  Past Medical History  Diagnosis Date  . Hypertension   . Hypercholesteremia   . Chronic back pain   . Headache    Past Surgical History  Procedure Laterality Date  . Tubal ligation    . Cholecystectomy     History reviewed. No pertinent family history. Social History  Substance Use Topics  . Smoking status: Current Every Day Smoker -- 0.50 packs/day    Types: Cigarettes  . Smokeless tobacco: None  . Alcohol Use: No   OB History    No data available     Review of Systems  Constitutional: Negative for fever.  Respiratory: Negative for chest tightness and shortness of breath.   Cardiovascular: Negative for chest pain.  Gastrointestinal: Positive for abdominal pain. Negative for nausea, vomiting, diarrhea and constipation.  Genitourinary: Positive for flank pain. Negative for dysuria, hematuria and vaginal discharge.  Musculoskeletal: Positive for back pain.  All other systems reviewed and are negative.     Allergies  Sulfa antibiotics  Home Medications   Prior to Admission medications    Medication Sig Start Date End Date Taking? Authorizing Provider  meclizine (ANTIVERT) 25 MG tablet Take 1 tablet (25 mg total) by mouth 3 (three) times daily as needed for dizziness. 07/23/14  Yes Samuel Jester, DO  ondansetron (ZOFRAN ODT) 4 MG disintegrating tablet Take 1 tablet (4 mg total) by mouth every 8 (eight) hours as needed for nausea or vomiting. 07/23/14  Yes Samuel Jester, DO  Aspirin-Salicylamide-Caffeine (BC HEADACHE) 325-95-16 MG TABS Take 1 packet by mouth daily as needed. Once taken as needed for pain    Historical Provider, MD  dicyclomine (BENTYL) 20 MG tablet Take 1 tablet (20 mg total) by mouth 2 (two) times daily. 10/04/15   Shon Baton, MD  hydrochlorothiazide (HYDRODIURIL) 25 MG tablet Take 25 mg by mouth every morning.    Historical Provider, MD  HYDROcodone-acetaminophen (NORCO/VICODIN) 5-325 MG tablet Take 1-2 tablets by mouth every 6 (six) hours as needed. 10/04/15   Shon Baton, MD   BP 110/68 mmHg  Pulse 74  Temp(Src) 97.8 F (36.6 C) (Oral)  Resp 16  Ht  (1.626 m)  Wt 155 lb (70.308 kg)  BMI 26.59 kg/m2  SpO2 100%  LMP 09/30/2015 Physical Exam  Constitutional: She is oriented to person, place, and time. She appears well-developed and well-nourished.  Uncomfortable appearing but nontoxic  HENT:  Head: Normocephalic and atraumatic.  Cardiovascular: Normal rate, regular rhythm and normal heart sounds.   Pulmonary/Chest: Effort normal. No respiratory distress. She has no wheezes.  Abdominal: Soft. Bowel sounds are normal. There is  no tenderness. There is no rebound and no guarding.  Musculoskeletal: She exhibits no edema.  Neurological: She is alert and oriented to person, place, and time.  Skin: Skin is warm and dry.  Psychiatric: She has a normal mood and affect.  Nursing note and vitals reviewed.   ED Course  Procedures (including critical care time) Labs Review Labs Reviewed  CBC WITH DIFFERENTIAL/PLATELET - Abnormal; Notable  for the following:    WBC 11.2 (*)    Lymphs Abs 4.4 (*)    All other components within normal limits  COMPREHENSIVE METABOLIC PANEL - Abnormal; Notable for the following:    Glucose, Bld 100 (*)    ALT 13 (*)    All other components within normal limits  URINALYSIS, ROUTINE W REFLEX MICROSCOPIC (NOT AT [facility]) - Abnormal; Notable for the following:    Hgb urine dipstick LARGE (*)    All other components within normal limits  URINE MICROSCOPIC-ADD ON - Abnormal; Notable for the following:    Squamous Epithelial / LPF 0-5 (*)    Bacteria, UA MANY (*)    All other components within normal limits  LIPASE, BLOOD  PREGNANCY, URINE    Imaging Review Ct Renal Stone Study  10/04/2015  CLINICAL DATA:  Right-sided flank pain since this morning EXAM: CT ABDOMEN AND PELVIS WITHOUT CONTRAST TECHNIQUE: Multidetector CT imaging of the abdomen and pelvis was performed following the standard protocol without IV contrast. COMPARISON:  None. FINDINGS: Lung bases are free of acute infiltrate or sizable effusion. The gallbladder has been surgically removed. Small hypodensities less than 1 cm are noted within the right lobe of the liver likely representing cysts. The spleen, adrenal glands and pancreas are within normal limits. Kidneys are well visualized bilaterally. No renal calculi or urinary tract obstructive changes are seen. Slight malrotation of the right kidney is noted within the anteriorly oriented renal pelvis. The appendix is within normal limits. The bladder is well distended. The uterus is prominent which may be related some uterine fibroid disease. A left ovarian cystic structure is noted measuring 3.7 cm in greatest dimension. Some follicular changes are noted on the right. No free pelvic fluid is seen. No bony abnormality is noted. IMPRESSION: Left ovarian cystic change. No renal calculi or urinary tract obstructive changes. Electronically Signed   By: Alcide CleverMark  Lukens M.D.   On: 10/04/2015 07:39   I have  personally reviewed and evaluated these images and lab results as part of my medical decision-making.   EKG Interpretation None      MDM   Final diagnoses:  Pain of upper abdomen    Patient presents with crampy upper abdominal pain and right flank pain. Acute in onset. No other associated symptoms. Uncomfortable appearing but nontoxic on exam. No history of kidney stones. No significant reproducible tenderness on exam. Basic labwork obtained and largely reassuring. On recheck, patient continues to endorse pain after pain medication and nausea medicine. CT stone study obtained to rule out kidney stone. This was negative. Patient reports some improvement of pain on recheck. Her exam remains nontender. Considerations include early viral illness, peptic ulcer, gastritis. Will discharge home.  After history, exam, and medical workup I feel the patient has been appropriately medically screened and is safe for discharge home. Pertinent diagnoses were discussed with the patient. Patient was given return precautions.     Shon Batonourtney F Janelle Culton, MD 10/04/15 2306

## 2015-10-04 NOTE — Discharge Instructions (Signed)

## 2015-10-04 NOTE — ED Notes (Signed)
Pt given water 

## 2016-04-11 ENCOUNTER — Emergency Department
Admission: EM | Admit: 2016-04-11 | Discharge: 2016-04-11 | Disposition: A | Discharge status: Home / Self Care | Payer: PRIVATE HEALTH INSURANCE | Attending: Emergency Medicine | Admitting: Emergency Medicine

## 2016-04-11 ENCOUNTER — Emergency Department: Payer: PRIVATE HEALTH INSURANCE

## 2016-04-11 ENCOUNTER — Encounter: Payer: Self-pay | Admitting: Emergency Medicine

## 2016-04-11 DIAGNOSIS — G44219 Episodic tension-type headache, not intractable: Secondary | ICD-10-CM | POA: Diagnosis not present

## 2016-04-11 DIAGNOSIS — F17210 Nicotine dependence, cigarettes, uncomplicated: Secondary | ICD-10-CM | POA: Insufficient documentation

## 2016-04-11 DIAGNOSIS — I10 Essential (primary) hypertension: Secondary | ICD-10-CM | POA: Diagnosis not present

## 2016-04-11 DIAGNOSIS — R112 Nausea with vomiting, unspecified: Secondary | ICD-10-CM | POA: Diagnosis present

## 2016-04-11 MED ORDER — SODIUM CHLORIDE 0.9 % IV BOLUS (SEPSIS)
1000.0000 mL | Freq: Once | INTRAVENOUS | Status: AC
  Administered 2016-04-11: 1000 mL via INTRAVENOUS

## 2016-04-11 MED ORDER — TRAMADOL HCL 50 MG PO TABS: 50.0000 mg | ORAL_TABLET | Freq: Four times a day (QID) | ORAL | 0 refills | Status: DC | PRN

## 2016-04-11 MED ORDER — METOCLOPRAMIDE HCL 10 MG PO TABS: 10.0000 mg | ORAL_TABLET | Freq: Three times a day (TID) | ORAL | 0 refills | Status: AC | PRN

## 2016-04-11 MED ORDER — ONDANSETRON 4 MG PO TBDP
4.0000 mg | ORAL_TABLET | Freq: Once | ORAL | Status: AC
  Administered 2016-04-11: 4 mg via ORAL

## 2016-04-11 MED ORDER — IBUPROFEN 800 MG PO TABS: 800.0000 mg | ORAL_TABLET | Freq: Three times a day (TID) | ORAL | 0 refills | Status: DC | PRN

## 2016-04-11 MED ORDER — ONDANSETRON 4 MG PO TBDP
ORAL_TABLET | ORAL | Status: AC
  Administered 2016-04-11: 4 mg via ORAL
  Filled 2016-04-11: qty 1

## 2016-04-11 MED ORDER — KETOROLAC TROMETHAMINE 30 MG/ML IJ SOLN
30.0000 mg | Freq: Once | INTRAMUSCULAR | Status: AC
  Administered 2016-04-11: 30 mg via INTRAVENOUS
  Filled 2016-04-11: qty 1

## 2016-04-11 NOTE — ED Provider Notes (Signed)
Time Seen: Approximately 2042  I have reviewed the triage notes  Chief Complaint: Headache and Emesis   History of Present Illness: Shirley Thompson is a 40 y.o. female who states that she has a long history of chronic headaches and describes a headache on the left side this persisted since this morning. She states is not unusual for her to have similar headaches and also to have some nausea and maybe vomited once but she hasn't had any episodes she can think of where she had persistent vomiting and nausea. Usually she'll lie down and the headache will resolve on its own with over-the-counter pain medication. She denies any fever or head trauma. She denies any midline neck pain but states it does hurt on the left side of her neck up into the occipital region. She denies any head trauma. She denies any fever. She denies any focal weaknesses had some numbness and tingling in her hands and feet and describes some mild subjective left-sided weakness. She states she normally gets a similar headache approximately every 3 months.  Past Medical History:  Diagnosis Date  . Chronic back pain   . Headache   . Hypercholesteremia   . Hypertension     There are no active problems to display for this patient.   Past Surgical History:  Procedure Laterality Date  . CHOLECYSTECTOMY    . TUBAL LIGATION      Past Surgical History:  Procedure Laterality Date  . CHOLECYSTECTOMY    . TUBAL LIGATION      Current Outpatient Rx  . Order #: [id number] Class: Historical Med  . Order #: [id number] Class: Print  . Order #: [id number] Class: Historical Med  . Order #: [id number] Class: Print  . Order #: [id number] Class: Print  . Order #: [id number] Class: Print  . Order #: [id number] Class: Print  . Order #: [id number] Class: Print  . Order #: [id number] Class: Print    Allergies:  Sulfa antibiotics  Family History: No family history on file.  Social History: Social History  Substance Use Topics  . Smoking  status: Current Every Day Smoker    Packs/day: 0.50    Types: Cigarettes  . Smokeless tobacco: Never Used  . Alcohol use No     Review of Systems:   10 point review of systems was performed and was otherwise negative:  Constitutional: No fever Eyes: No visual disturbances ENT: No sore throat, ear pain Cardiac: No chest pain Respiratory: No shortness of breath, wheezing, or stridor Abdomen: No abdominal pain, no vomiting, No diarrhea Endocrine: No weight loss, No night sweats Extremities: No peripheral edema, cyanosis Skin: No rashes, easy bruising Neurologic: NoCurrent focal weakness, trouble with speech or swollowing. She does describe some numbness and tingling in her hands and feet Urologic: No dysuria, Hematuria, or urinary frequency *  Physical Exam:  ED Triage Vitals [04/11/16 2000]  Enc Vitals Group     BP (!) 152/101     Pulse Rate 80     Resp 20     Temp 98.7 F (37.1 C)     Temp Source Oral     SpO2 100 %     Weight 166 lb (75.3 kg)     Height 5\' 4"  (1.626 m)     Head Circumference      Peak Flow      Pain Score 10     Pain Loc      Pain Edu?      Excl. in GC?     General: Awake ,  Alert , and Oriented times 3; GCS 15 Head: Normal cephalic , atraumatic. She does have a reproducible nature to her headache with palpation across the left temporal parietal, occipital and into the left. Spinal muscle region. Eyes: Pupils equal , round, reactive to light. No papilledema and extraocular eye movements are intact, conjunctiva is clear. Nose/Throat: No nasal drainage, patent upper airway without erythema or exudate. No midline tenderness or meningeal signs Neck: Supple, Full range of motion, No anterior adenopathy or palpable thyroid masses Lungs: Clear to ascultation without wheezes , rhonchi, or rales Heart: Regular rate, regular rhythm without murmurs , gallops , or rubs Abdomen: Soft, non tender without rebound, guarding , or rigidity; bowel sounds positive and  symmetric in all 4 quadrants. No organomegaly .        Extremities: 2 plus symmetric pulses. No edema, clubbing or cyanosis Neurologic: normal ambulation, Motor symmetric without deficits, sensory intact Skin: warm, dry, no rashes   Radiology:  "Ct Head Wo Contrast  Result Date: 04/11/2016 CLINICAL DATA:  Headaches and vomiting since this morning EXAM: CT HEAD WITHOUT CONTRAST TECHNIQUE: Contiguous axial images were obtained from the base of the skull through the vertex without intravenous contrast. COMPARISON:  July 23, 2014 FINDINGS: Brain: No evidence of acute infarction, hemorrhage, hydrocephalus, extra-axial collection or mass lesion/mass effect. Vascular: No hyperdense vessel or unexpected calcification. Skull: Normal. Negative for fracture or focal lesion. Sinuses/Orbits: No acute finding. Right sphenoid sinus opacification is unchanged. Other: None. IMPRESSION: No focal acute intracranial abnormality identified. Electronically Signed   By: Sherian Rein M.D.   On: 04/11/2016 21:36  "  I personally reviewed the radiologic studies     ED Course: * Patient's currently here with her kids and we cannot give her any medications that would make her drowsy. Patient received an IV fluid bolus and also Toradol and Zofran she still states some mild nausea. Reviewed the findings of the CAT scan the patient at this time declines on a lumbar puncture for further assessment of her headache. I do not have a strong clinical suspicion for acute meningitis, encephalitis, aneurysm or any other life-threatening concerns at this time specially given the reproducible nature to her headache. There is no associated rashes and I felt this may be a muscle tension headache with migraine equivalent characteristics.   Clinical Course     Assessment: Acute unspecified cephalgia   Final Clinical Impression:   Final diagnoses:  Episodic tension-type headache, not intractable     Plan:   Outpatient " New Prescriptions   IBUPROFEN (ADVIL,MOTRIN) 800 MG TABLET    Take 1 tablet (800 mg total) by mouth every 8 (eight) hours as needed.   METOCLOPRAMIDE (REGLAN) 10 MG TABLET    Take 1 tablet (10 mg total) by mouth every 8 (eight) hours as needed for nausea.   TRAMADOL (ULTRAM) 50 MG TABLET    Take 1 tablet (50 mg total) by mouth every 6 (six) hours as needed.  " Patient was advised to return immediately if condition worsens. Patient was advised to follow up with their primary care physician or other specialized physicians involved in their outpatient care. The patient and/or family member/power of attorney had laboratory results reviewed at the bedside. All questions and concerns were addressed and appropriate discharge instructions were distributed by the nursing staff.             Jennye Moccasin, MD 04/11/16 2201

## 2016-04-11 NOTE — Discharge Instructions (Signed)
Please return immediately if condition worsens. Please contact her primary physician or the physician you were given for referral. If you have any specialist physicians involved in her treatment and plan please also contact them. Thank you for using [place] emergency Department. ° °

## 2016-04-11 NOTE — ED Triage Notes (Signed)
Pt presents to ED with c/o headache and vomiting since this morning, pt reports hx of headaches. Pt denies blood in emesis. Pt denies chest pain, abdominal pain, or shortness of breath. Pt alert and oriented x 4, no increased work in breathing noted.

## 2016-04-11 NOTE — ED Notes (Signed)
Headache and vomiting since this am

## 2016-10-25 ENCOUNTER — Encounter (HOSPITAL_COMMUNITY): Payer: Self-pay | Admitting: *Deleted

## 2016-10-25 ENCOUNTER — Emergency Department (HOSPITAL_COMMUNITY)
Admission: EM | Admit: 2016-10-25 | Discharge: 2016-10-25 | Disposition: A | Discharge status: Home / Self Care | Payer: Self-pay | Attending: Emergency Medicine | Admitting: Emergency Medicine

## 2016-10-25 DIAGNOSIS — F17210 Nicotine dependence, cigarettes, uncomplicated: Secondary | ICD-10-CM | POA: Insufficient documentation

## 2016-10-25 DIAGNOSIS — K047 Periapical abscess without sinus: Secondary | ICD-10-CM | POA: Insufficient documentation

## 2016-10-25 DIAGNOSIS — I10 Essential (primary) hypertension: Secondary | ICD-10-CM | POA: Insufficient documentation

## 2016-10-25 DIAGNOSIS — Z79899 Other long term (current) drug therapy: Secondary | ICD-10-CM | POA: Insufficient documentation

## 2016-10-25 MED ORDER — HYDROCODONE-ACETAMINOPHEN 7.5-325 MG/15ML PO SOLN
10.0000 mL | Freq: Once | ORAL | Status: AC
  Administered 2016-10-25: 10 mL via ORAL
  Filled 2016-10-25: qty 15

## 2016-10-25 MED ORDER — AMOXICILLIN 250 MG/5ML PO SUSR: 500.0000 mg | Freq: Three times a day (TID) | ORAL | 0 refills | Status: AC

## 2016-10-25 MED ORDER — HYDROCODONE-ACETAMINOPHEN 7.5-325 MG/15ML PO SOLN: 10.0000 mL | Freq: Four times a day (QID) | ORAL | 0 refills | Status: AC | PRN

## 2016-10-25 MED ORDER — AMOXICILLIN 250 MG/5ML PO SUSR
500.0000 mg | Freq: Once | ORAL | Status: AC
  Administered 2016-10-25: 500 mg via ORAL
  Filled 2016-10-25: qty 10

## 2016-10-25 NOTE — ED Provider Notes (Signed)
[facility]-EMERGENCY DEPT Provider Note   CSN: [id number] Arrival date & time: 10/25/16  1126     History   Chief Complaint Chief Complaint  Patient presents with  . Dental Pain    HPI Shirley Thompson is a 41 y.o. female presenting with a one day history of dental pain and gingival swelling.   The patient has a history of decay in her left upper wisdom tooth which has recently started to cause increased  pain.  There has been no fevers, chills, nausea or vomiting, also no complaint of difficulty swallowing, although chewing makes pain worse.  She endorses cold sensitivity and has to eat and drink out of the right side of her mouth to avoid pain.  The patient has tried oragel and bc powders without relief of symptoms.  She has seen [facility] in the past for her dental care.  .  The history is provided by the patient.    Past Medical History:  Diagnosis Date  . Chronic back pain   . Headache   . Hypercholesteremia   . Hypertension     There are no active problems to display for this patient.   Past Surgical History:  Procedure Laterality Date  . CHOLECYSTECTOMY    . TUBAL LIGATION      OB History    No data available       Home Medications    Prior to Admission medications   Medication Sig Start Date End Date Taking? Authorizing Provider  amoxicillin (AMOXIL) 250 MG/5ML suspension Take 10 mLs (500 mg total) by mouth 3 (three) times daily. 10/25/16 11/04/16  Burgess Amor, PA-C  Aspirin-Salicylamide-Caffeine (BC HEADACHE) 325-95-16 MG TABS Take 1 packet by mouth daily as needed. Once taken as needed for pain    Historical Provider, MD  dicyclomine (BENTYL) 20 MG tablet Take 1 tablet (20 mg total) by mouth 2 (two) times daily. 10/04/15   Shon Baton, MD  hydrochlorothiazide (HYDRODIURIL) 25 MG tablet Take 25 mg by mouth every morning.    Historical Provider, MD  HYDROcodone-acetaminophen (HYCET) 7.5-325 mg/15 ml solution Take 10 mLs by mouth every 6 (six)  hours as needed for severe pain. 10/25/16 10/25/17  Burgess Amor, PA-C  ibuprofen (ADVIL,MOTRIN) 800 MG tablet Take 1 tablet (800 mg total) by mouth every 8 (eight) hours as needed. 04/11/16   Jennye Moccasin, MD  meclizine (ANTIVERT) 25 MG tablet Take 1 tablet (25 mg total) by mouth 3 (three) times daily as needed for dizziness. 07/23/14   Samuel Jester, DO  metoCLOPramide (REGLAN) 10 MG tablet Take 1 tablet (10 mg total) by mouth every 8 (eight) hours as needed for nausea. 04/11/16 04/11/17  Jennye Moccasin, MD  ondansetron (ZOFRAN ODT) 4 MG disintegrating tablet Take 1 tablet (4 mg total) by mouth every 8 (eight) hours as needed for nausea or vomiting. 07/23/14   Samuel Jester, DO  traMADol (ULTRAM) 50 MG tablet Take 1 tablet (50 mg total) by mouth every 6 (six) hours as needed. 04/11/16   Jennye Moccasin, MD    Family History No family history on file.  Social History Social History  Substance Use Topics  . Smoking status: Current Every Day Smoker    Packs/day: 0.50    Types: Cigarettes  . Smokeless tobacco: Never Used  . Alcohol use No     Allergies   Sulfa antibiotics   Review of Systems Review of Systems  Constitutional: Negative for fever.  HENT: Positive for dental  problem. Negative for facial swelling and sore throat.   Respiratory: Negative for shortness of breath.   Musculoskeletal: Negative for neck pain and neck stiffness.     Physical Exam Updated Vital Signs BP (!) 151/92 (BP Location: Left Arm)   Pulse 94   Temp 98.8 F (37.1 C) (Oral)   Resp 18   Ht 5\' 4"  (1.626 m)   Wt 78 kg   LMP 10/11/2016   SpO2 98%   BMI 29.52 kg/m   Physical Exam  Constitutional: She is oriented to person, place, and time. She appears well-developed and well-nourished. No distress.  HENT:  Head: Normocephalic and atraumatic.  Right Ear: Tympanic membrane and external ear normal.  Left Ear: Tympanic membrane and external ear normal.  Mouth/Throat: Oropharynx is clear and  moist and mucous membranes are normal. No oral lesions. No trismus in the jaw. Dental abscesses present.    Eyes: Conjunctivae are normal.  Neck: Normal range of motion. Neck supple.  Cardiovascular: Normal rate and normal heart sounds.   Pulmonary/Chest: Effort normal.  Abdominal: She exhibits no distension.  Musculoskeletal: Normal range of motion.  Lymphadenopathy:    She has no cervical adenopathy.  Neurological: She is alert and oriented to person, place, and time.  Skin: Skin is warm and dry. No erythema.  Psychiatric: She has a normal mood and affect.     ED Treatments / Results  Labs (all labs ordered are listed, but only abnormal results are displayed) Labs Reviewed - No data to display  EKG  EKG Interpretation None       Radiology No results found.  Procedures Procedures (including critical care time)  Medications Ordered in ED Medications  amoxicillin (AMOXIL) 250 MG/5ML suspension 500 mg (not administered)  HYDROcodone-acetaminophen (HYCET) 7.5-325 mg/15 ml solution 10 mL (not administered)     Initial Impression / Assessment and Plan / ED Course  I have reviewed the triage vital signs and the nursing notes.  Pertinent labs & imaging results that were available during my care of the patient were reviewed by me and considered in my medical decision making (see chart for details).     Simple dental abscess without facial cellulitis.  Amoxil, hydrocodone, referral to dentistry. Return precautions discussed.  Final Clinical Impressions(s) / ED Diagnoses   Final diagnoses:  Dental abscess    New Prescriptions New Prescriptions   AMOXICILLIN (AMOXIL) 250 MG/5ML SUSPENSION    Take 10 mLs (500 mg total) by mouth 3 (three) times daily.   HYDROCODONE-ACETAMINOPHEN (HYCET) 7.5-325 MG/15 ML SOLUTION    Take 10 mLs by mouth every 6 (six) hours as needed for severe pain.     Burgess AmorJulie Chayanne Speir, PA-C 10/25/16 1307    Maia PlanJoshua G Long, MD 10/25/16 [phone]

## 2016-10-25 NOTE — Discharge Instructions (Signed)
Complete your entire course of antibiotics as prescribed.  You  may use the hydrocodone for pain relief but do not drive within 4 hours of taking as this will make you drowsy.  Avoid applying heat or ice to this abscess area which can worsen your symptoms.  You may use warm salt water swish and spit treatment or half peroxide and water swish and spit after meals to keep this area clean as discussed.  Call the dentist listed above for further management of your symptoms.  

## 2016-10-25 NOTE — ED Triage Notes (Signed)
Pt come in with left side dental pain. The swelling began yesterday.

## 2019-12-21 ENCOUNTER — Emergency Department (HOSPITAL_COMMUNITY)
Admission: EM | Admit: 2019-12-21 | Discharge: 2019-12-21 | Discharge status: Against Medical Advice | Payer: Medicaid - Out of State

## 2019-12-21 ENCOUNTER — Other Ambulatory Visit: Payer: Self-pay

## 2020-07-09 ENCOUNTER — Emergency Department (HOSPITAL_COMMUNITY): Payer: Medicaid - Out of State

## 2020-07-09 ENCOUNTER — Other Ambulatory Visit: Payer: Self-pay

## 2020-07-09 DIAGNOSIS — M25512 Pain in left shoulder: Secondary | ICD-10-CM | POA: Insufficient documentation

## 2020-07-09 DIAGNOSIS — R202 Paresthesia of skin: Secondary | ICD-10-CM | POA: Diagnosis not present

## 2020-07-09 DIAGNOSIS — Z5321 Procedure and treatment not carried out due to patient leaving prior to being seen by health care provider: Secondary | ICD-10-CM | POA: Diagnosis not present

## 2020-07-09 NOTE — ED Triage Notes (Signed)
Pt c/o left shoulder pain that started today at work. 1135 today. Took a goody power. States it comes and goes with pain and sometimes numb

## 2020-07-10 ENCOUNTER — Emergency Department (HOSPITAL_COMMUNITY)
Admission: EM | Admit: 2020-07-10 | Discharge: 2020-07-10 | Disposition: A | Discharge status: Home / Self Care | Payer: Medicaid - Out of State | Attending: Emergency Medicine | Admitting: Emergency Medicine

## 2021-04-24 ENCOUNTER — Other Ambulatory Visit: Payer: Self-pay

## 2021-04-24 ENCOUNTER — Emergency Department (HOSPITAL_COMMUNITY)
Admission: EM | Admit: 2021-04-24 | Discharge: 2021-04-25 | Disposition: A | Discharge status: Home / Self Care | Payer: Medicaid - Out of State | Attending: Emergency Medicine | Admitting: Emergency Medicine

## 2021-04-24 DIAGNOSIS — Z79899 Other long term (current) drug therapy: Secondary | ICD-10-CM | POA: Diagnosis not present

## 2021-04-24 DIAGNOSIS — F17210 Nicotine dependence, cigarettes, uncomplicated: Secondary | ICD-10-CM | POA: Insufficient documentation

## 2021-04-24 DIAGNOSIS — I10 Essential (primary) hypertension: Secondary | ICD-10-CM | POA: Insufficient documentation

## 2021-04-24 DIAGNOSIS — X58XXXA Exposure to other specified factors, initial encounter: Secondary | ICD-10-CM | POA: Insufficient documentation

## 2021-04-24 DIAGNOSIS — S299XXA Unspecified injury of thorax, initial encounter: Secondary | ICD-10-CM | POA: Diagnosis present

## 2021-04-24 HISTORY — DX: Gastro-esophageal reflux disease without esophagitis: K21.9

## 2021-04-24 HISTORY — DX: Depression, unspecified: F32.A

## 2021-04-24 HISTORY — DX: Anxiety disorder, unspecified: F41.9

## 2021-04-24 NOTE — ED Triage Notes (Signed)
Pt with c/o R rib pain that radiates to under R breast that started 30 mins PTA. Pt states it makes it hard to breathe.

## 2021-04-25 ENCOUNTER — Encounter (HOSPITAL_COMMUNITY): Payer: Self-pay | Admitting: Emergency Medicine

## 2021-04-25 ENCOUNTER — Emergency Department (HOSPITAL_COMMUNITY): Payer: Medicaid - Out of State

## 2021-04-25 MED ORDER — ONDANSETRON 4 MG PO TBDP
4.0000 mg | ORAL_TABLET | Freq: Once | ORAL | Status: AC
  Administered 2021-04-25: 4 mg via ORAL
  Filled 2021-04-25: qty 1

## 2021-04-25 MED ORDER — OXYCODONE-ACETAMINOPHEN 5-325 MG PO TABS
2.0000 | ORAL_TABLET | Freq: Once | ORAL | Status: AC
  Administered 2021-04-25: 2 via ORAL
  Filled 2021-04-25: qty 2

## 2021-04-25 MED ORDER — DIAZEPAM 2 MG PO TABS
2.0000 mg | ORAL_TABLET | Freq: Once | ORAL | Status: AC
  Administered 2021-04-25: 2 mg via ORAL
  Filled 2021-04-25: qty 1

## 2021-04-25 MED ORDER — KETOROLAC TROMETHAMINE 60 MG/2ML IM SOLN
60.0000 mg | Freq: Once | INTRAMUSCULAR | Status: AC
  Administered 2021-04-25: 60 mg via INTRAMUSCULAR
  Filled 2021-04-25: qty 2

## 2021-04-25 MED ORDER — CYCLOBENZAPRINE HCL 10 MG PO TABS: 10.0000 mg | ORAL_TABLET | Freq: Two times a day (BID) | ORAL | 0 refills | Status: DC | PRN

## 2021-04-25 NOTE — ED Notes (Signed)
ED Provider at bedside. 

## 2021-04-25 NOTE — ED Notes (Signed)
Patient educated about not driving or performing other critical tasks (such as operating heavy machinery, caring for infant/toddler/child) due to sedative nature of narcotic medications received while in the ED.  Pt/caregiver verbalized understanding.   

## 2021-04-25 NOTE — ED Notes (Signed)
Patient transported to X-ray 

## 2021-04-25 NOTE — ED Provider Notes (Signed)
[facility] EMERGENCY DEPARTMENT Provider Note   CSN: [id number] Arrival date & time: 04/24/21  2343     History Chief Complaint  Patient presents with   Rib Injury    Shirley Thompson is a 45 y.o. female.  45 year old female who was leaning over her center console when she started having right-sided anterior rib pain of the breast.  Patient states that it is severe in nature.  There is no states deep breath.  States she never radiates before.  She denies any fever cough.  It started at the time and she is leaning over this consult.  She does not feel any cracking or any other trauma to suggest fracture.  Has not tried thing for symptoms just came straight here       Past Medical History:  Diagnosis Date   Anxiety    Chronic back pain    Depression    GERD (gastroesophageal reflux disease)    Headache    Hypercholesteremia    Hypertension     There are no problems to display for this patient.   Past Surgical History:  Procedure Laterality Date   CHOLECYSTECTOMY     TUBAL LIGATION       OB History   No obstetric history on file.     History reviewed. No pertinent family history.  Social History   Tobacco Use   Smoking status: Every Day    Packs/day: 0.50    Types: Cigarettes   Smokeless tobacco: Never  Substance Use Topics   Alcohol use: No   Drug use: No    Home Medications Prior to Admission medications   Medication Sig Start Date End Date Taking? Authorizing Provider  cyclobenzaprine (FLEXERIL) 10 MG tablet Take 1 tablet (10 mg total) by mouth 2 (two) times daily as needed for muscle spasms. 04/25/21  Yes Lani Mendiola, Barbara Cower, MD  Aspirin-Salicylamide-Caffeine (BC HEADACHE) 325-95-16 MG TABS Take 1 packet by mouth daily as needed. Once taken as needed for pain    [provider]  dicyclomine (BENTYL) 20 MG tablet Take 1 tablet (20 mg total) by mouth 2 (two) times daily. 10/04/15   Horton, Mayer Masker, MD  hydrochlorothiazide (HYDRODIURIL) 25 MG tablet  Take 25 mg by mouth every morning.    [provider]  ibuprofen (ADVIL,MOTRIN) 800 MG tablet Take 1 tablet (800 mg total) by mouth every 8 (eight) hours as needed. 04/11/16   Jennye Moccasin, MD  meclizine (ANTIVERT) 25 MG tablet Take 1 tablet (25 mg total) by mouth 3 (three) times daily as needed for dizziness. 07/23/14   Samuel Jester, DO  metoCLOPramide (REGLAN) 10 MG tablet Take 1 tablet (10 mg total) by mouth every 8 (eight) hours as needed for nausea. 04/11/16 04/11/17  Jennye Moccasin, MD  ondansetron (ZOFRAN ODT) 4 MG disintegrating tablet Take 1 tablet (4 mg total) by mouth every 8 (eight) hours as needed for nausea or vomiting. 07/23/14   Samuel Jester, DO  traMADol (ULTRAM) 50 MG tablet Take 1 tablet (50 mg total) by mouth every 6 (six) hours as needed. 04/11/16   Jennye Moccasin, MD    Allergies    Amlodipine, Azithromycin, Sulfa antibiotics, Amoxicillin, and Lisinopril  Review of Systems   Review of Systems  All other systems reviewed and are negative.  Physical Exam Updated Vital Signs BP 123/81   Pulse 88   Temp 99.2 F (37.3 C) (Oral)   Resp 15   Ht 5\' 4"  (1.626 m)  Wt 81.6 kg   LMP 04/17/2021 (Exact Date)   SpO2 100%   BMI 30.90 kg/m   Physical Exam Vitals and nursing note reviewed.  Constitutional:      Appearance: She is well-developed.  HENT:     Head: Normocephalic and atraumatic.     Mouth/Throat:     Mouth: Mucous membranes are moist.     Pharynx: Oropharynx is clear.  Eyes:     Pupils: Pupils are equal, round, and reactive to light.  Cardiovascular:     Rate and Rhythm: Normal rate and regular rhythm.  Pulmonary:     Effort: No respiratory distress.     Breath sounds: No stridor.  Abdominal:     General: There is no distension.  Musculoskeletal:        General: Tenderness (right anterior ribs) present.     Cervical back: Normal range of motion.  Skin:    General: Skin is warm.  Neurological:     General: No focal deficit  present.     Mental Status: She is alert.    ED Results / Procedures / Treatments   Labs (all labs ordered are listed, but only abnormal results are displayed) Labs Reviewed - No data to display  EKG None  Radiology DG Ribs Unilateral W/Chest Right  Result Date: 04/25/2021 CLINICAL DATA:  Acute right rib pain EXAM: RIGHT RIBS AND CHEST - 3+ VIEW COMPARISON:  None. FINDINGS: No fracture or other bone lesions are seen involving the ribs. There is no evidence of pneumothorax or pleural effusion. Both lungs are clear. Heart size and mediastinal contours are within normal limits. IMPRESSION: Negative. Electronically Signed   By: Helyn Numbers M.D.   On: 04/25/2021 00:44    Procedures Procedures   Medications Ordered in ED Medications  oxyCODONE-acetaminophen (PERCOCET/ROXICET) 5-325 MG per tablet 2 tablet (2 tablets Oral Given 04/25/21 0015)  diazepam (VALIUM) tablet 2 mg (2 mg Oral Given 04/25/21 0112)  ketorolac (TORADOL) injection 60 mg (60 mg Intramuscular Given 04/25/21 0112)    ED Course  I have reviewed the triage vital signs and the nursing notes.  Pertinent labs & imaging results that were available during my care of the patient were reviewed by me and considered in my medical decision making (see chart for details).    MDM Rules/Calculators/A&P                         Xr to eval for fracture.   Xr negative. Likely MSK, valium ordered for spasm.   Symptoms improved. Discussed medication options and patient prefers minimal medication changes.   Final Clinical Impression(s) / ED Diagnoses Final diagnoses:  Chest injury, initial encounter    Rx / DC Orders ED Discharge Orders          Ordered    cyclobenzaprine (FLEXERIL) 10 MG tablet  2 times daily PRN        04/25/21 0134             Quinterius Gaida, Barbara Cower, MD 04/25/21 0140

## 2021-08-01 ENCOUNTER — Encounter (HOSPITAL_COMMUNITY): Payer: Self-pay

## 2021-08-01 ENCOUNTER — Emergency Department (HOSPITAL_COMMUNITY)
Admission: EM | Admit: 2021-08-01 | Discharge: 2021-08-02 | Disposition: A | Discharge status: Home / Self Care | Payer: Medicaid Other | Attending: Emergency Medicine | Admitting: Emergency Medicine

## 2021-08-01 ENCOUNTER — Emergency Department (HOSPITAL_COMMUNITY): Payer: Medicaid Other

## 2021-08-01 ENCOUNTER — Other Ambulatory Visit: Payer: Self-pay

## 2021-08-01 DIAGNOSIS — M5116 Intervertebral disc disorders with radiculopathy, lumbar region: Secondary | ICD-10-CM | POA: Diagnosis not present

## 2021-08-01 DIAGNOSIS — R449 Unspecified symptoms and signs involving general sensations and perceptions: Secondary | ICD-10-CM | POA: Insufficient documentation

## 2021-08-01 DIAGNOSIS — M48061 Spinal stenosis, lumbar region without neurogenic claudication: Secondary | ICD-10-CM | POA: Diagnosis not present

## 2021-08-01 DIAGNOSIS — F17210 Nicotine dependence, cigarettes, uncomplicated: Secondary | ICD-10-CM | POA: Diagnosis not present

## 2021-08-01 DIAGNOSIS — I10 Essential (primary) hypertension: Secondary | ICD-10-CM | POA: Diagnosis not present

## 2021-08-01 DIAGNOSIS — M5126 Other intervertebral disc displacement, lumbar region: Secondary | ICD-10-CM | POA: Diagnosis not present

## 2021-08-01 DIAGNOSIS — Q7649 Other congenital malformations of spine, not associated with scoliosis: Secondary | ICD-10-CM | POA: Diagnosis not present

## 2021-08-01 DIAGNOSIS — M545 Low back pain, unspecified: Secondary | ICD-10-CM | POA: Diagnosis not present

## 2021-08-01 DIAGNOSIS — M47816 Spondylosis without myelopathy or radiculopathy, lumbar region: Secondary | ICD-10-CM | POA: Diagnosis not present

## 2021-08-01 NOTE — ED Triage Notes (Signed)
Pt took Ibuprofen 800mg  pta.

## 2021-08-01 NOTE — ED Triage Notes (Signed)
Lower back pain x 3 hours, started while lifting groceries.

## 2021-08-01 NOTE — ED Notes (Signed)
Pt denies possibility of pregnancy.

## 2021-08-02 ENCOUNTER — Emergency Department (HOSPITAL_COMMUNITY): Payer: Medicaid Other

## 2021-08-02 DIAGNOSIS — M48061 Spinal stenosis, lumbar region without neurogenic claudication: Secondary | ICD-10-CM | POA: Diagnosis not present

## 2021-08-02 DIAGNOSIS — M47816 Spondylosis without myelopathy or radiculopathy, lumbar region: Secondary | ICD-10-CM | POA: Diagnosis not present

## 2021-08-02 DIAGNOSIS — M5126 Other intervertebral disc displacement, lumbar region: Secondary | ICD-10-CM | POA: Diagnosis not present

## 2021-08-02 DIAGNOSIS — Q7649 Other congenital malformations of spine, not associated with scoliosis: Secondary | ICD-10-CM | POA: Diagnosis not present

## 2021-08-02 LAB — BASIC METABOLIC PANEL
Anion gap: 5 (ref 5–15)
BUN: 13 mg/dL (ref 6–20)
CO2: 26 mmol/L (ref 22–32)
Calcium: 9 mg/dL (ref 8.9–10.3)
Chloride: 108 mmol/L (ref 98–111)
Creatinine, Ser: 0.92 mg/dL (ref 0.44–1.00)
GFR, Estimated: >60 mL/min (ref >60)
Glucose, Bld: 98 mg/dL (ref 70–99)
Potassium: 4 mmol/L (ref 3.5–5.1)
Sodium: 139 mmol/L (ref 135–145)

## 2021-08-02 LAB — CBC
HCT: 39.7 % (ref 36.0–46.0)
Hemoglobin: 13.2 g/dL (ref 12.0–15.0)
MCH: 28.4 pg (ref 26.0–34.0)
MCHC: 33.2 g/dL (ref 30.0–36.0)
MCV: 85.4 fL (ref 80.0–100.0)
Platelets: 184 10*3/uL (ref 150–400)
RBC: 4.65 MIL/uL (ref 3.87–5.11)
RDW: 13.7 % (ref 11.5–15.5)
WBC: 10.5 10*3/uL (ref 4.0–10.5)
nRBC: 0 % (ref 0.0–0.2)

## 2021-08-02 MED ORDER — KETOROLAC TROMETHAMINE 15 MG/ML IJ SOLN
30.0000 mg | Freq: Once | INTRAMUSCULAR | Status: AC
  Administered 2021-08-02: 30 mg via INTRAMUSCULAR
  Filled 2021-08-02: qty 2

## 2021-08-02 MED ORDER — PREDNISONE 10 MG (21) PO TBPK: ORAL_TABLET | Freq: Every day | ORAL | 0 refills | Status: DC

## 2021-08-02 MED ORDER — IBUPROFEN 800 MG PO TABS: 800.0000 mg | ORAL_TABLET | Freq: Three times a day (TID) | ORAL | 0 refills | Status: AC | PRN

## 2021-08-02 MED ORDER — OXYCODONE-ACETAMINOPHEN 5-325 MG PO TABS: 1.0000 | ORAL_TABLET | Freq: Four times a day (QID) | ORAL | 0 refills | Status: AC | PRN

## 2021-08-02 NOTE — ED Provider Notes (Signed)
[facility]-EMERGENCY DEPT [facility] Emergency Department Provider Note MRN:  [id number]  Arrival date & time: 08/02/21     Chief Complaint   Back Pain   History of Present Illness   Shirley Thompson is a 46 y.o. year-old female with a history of hypertension presenting to the ED with chief complaint of back pain.  Sudden onset left lower back pain this afternoon, happened when trying to lift groceries.  Associated with decreased sensation to the left leg.  Denies weakness to the legs, no bowel or bladder dysfunction.  No recent fever, no abdominal pain, no other complaints.  Pain is moderate to severe.  Review of Systems  A thorough review of systems was obtained and all systems are negative except as noted in the HPI and PMH.   Patient's Health History    Past Medical History:  Diagnosis Date   Anxiety    Chronic back pain    Depression    GERD (gastroesophageal reflux disease)    Headache    Hypercholesteremia    Hypertension     Past Surgical History:  Procedure Laterality Date   CHOLECYSTECTOMY     TUBAL LIGATION      History reviewed. No pertinent family history.  Social History   Socioeconomic History   Marital status: Legally Separated    Spouse name: Not on file   Number of children: Not on file   Years of education: Not on file   Highest education level: Not on file  Occupational History   Not on file  Tobacco Use   Smoking status: Every Day    Packs/day: 0.50    Types: Cigarettes   Smokeless tobacco: Never  Substance and Sexual Activity   Alcohol use: No   Drug use: No   Sexual activity: Yes    Birth control/protection: Surgical  Other Topics Concern   Not on file  Social History Narrative   Not on file   Social Determinants of Health   Financial Resource Strain: Not on file  Food Insecurity: Not on file  Transportation Needs: Not on file  Physical Activity: Not on file  Stress: Not on file  Social Connections: Not on file  Intimate Partner  Violence: Not on file     Physical Exam   Vitals:   08/02/21 0530 08/02/21 0630  BP: 134/84 134/81  Pulse: 82 60  Resp: 16 17  Temp:    SpO2: 100% 100%    CONSTITUTIONAL: Well-appearing, NAD NEURO:  Alert and oriented x 3, subjective decreased sensation to the left leg EYES:  eyes equal and reactive ENT/NECK:  no LAD, no JVD CARDIO: Regular rate, well-perfused, normal S1 and S2 PULM:  CTAB no wheezing or rhonchi GI/GU:  non-distended, non-tender MSK/SPINE:  No gross deformities, no edema; tenderness to palpation to the left lumbar back SKIN:  no rash, atraumatic   *Additional and/or pertinent findings included in MDM below  Diagnostic and Interventional Summary    EKG Interpretation  Date/Time:    Ventricular Rate:    PR Interval:    QRS Duration:   QT Interval:    QTC Calculation:   R Axis:     Text Interpretation:         Labs Reviewed  CBC  BASIC METABOLIC PANEL    DG Lumbar Spine Complete  Final Result    MR LUMBAR SPINE WO CONTRAST    (Results Pending)    Medications  ketorolac (TORADOL) 15 MG/ML injection 30 mg (30 mg Intramuscular  Given 08/02/21 0144)     Procedures  /  Critical Care Procedures  ED Course and Medical Decision Making  Initial Impression and Ddx Acute back pain with sensory deficit to the left leg, initial concern for radiculopathy versus myelopathy.  No bowel or bladder dysfunction, normal motor function.  X-rays without fracture, there was no significant trauma.  Will need MRI.  Interpretation of Diagnostics I personally reviewed the lumbar plain film and my interpretation is as follows: Overall unremarkable, no obvious fracture    Awaiting MRI.  Patient Reassessment and Ultimate Disposition/Management Signed out to oncoming provider at shift change.  Patient management required discussion with the following services or consulting groups:  None  Complexity of Problems Addressed Acute illness or injury that poses threat of  life of bodily function  Additional Data Reviewed and Analyzed Further history obtained from: Past medical history and medications listed in the EMR and Further history from spouse/family member  Patient Encounter Risk Assessment High:  Consideration of hospitalization  Elmer Sow. Pilar Plate, MD [facility] Emergency [facility] [email or web address]  Final Clinical Impressions(s) / ED Diagnoses     ICD-10-CM   1. Acute low back pain, unspecified back pain laterality, unspecified whether sciatica present  M54.50     2. Sensory deficit present  R44.9       ED Discharge Orders     None        Discharge Instructions Discussed with and Provided to Patient:   Discharge Instructions   None      Sabas Sous, MD 08/02/21 [phone]

## 2021-08-02 NOTE — ED Provider Notes (Signed)
Signout from Dr. Pilar Plate. 46 year old female complaining of back pain radiating to left leg with some decreased sensation of left leg its been going on for 1 day.  Plan is to follow-up on MRI spine. Physical Exam  BP 126/71 (BP Location: Right Arm)    Pulse 65    Temp 98.1 F (36.7 C) (Oral)    Resp 17    Ht 5\' 4"  (1.626 m)    Wt 81.6 kg    LMP 07/24/2021 (Exact Date)    SpO2 100%    BMI 30.88 kg/m   Physical Exam  ED Course/Procedures     Procedures  MDM  MRI showing L3-4 and L4-5 disc protrusion.  Reviewed results of MRI with patient.  Will treat symptomatically and recommend outpatient follow-up with neurosurgery.  Return instructions discussed       07/26/2021, MD 08/02/21 [id number]

## 2021-08-12 DIAGNOSIS — M5416 Radiculopathy, lumbar region: Secondary | ICD-10-CM | POA: Diagnosis not present

## 2021-08-12 DIAGNOSIS — M7070 Other bursitis of hip, unspecified hip: Secondary | ICD-10-CM | POA: Diagnosis not present

## 2021-08-12 DIAGNOSIS — I10 Essential (primary) hypertension: Secondary | ICD-10-CM | POA: Diagnosis not present

## 2021-08-12 DIAGNOSIS — Z6832 Body mass index (BMI) 32.0-32.9, adult: Secondary | ICD-10-CM | POA: Diagnosis not present

## 2021-08-20 ENCOUNTER — Encounter (HOSPITAL_COMMUNITY): Payer: Self-pay

## 2021-08-20 ENCOUNTER — Emergency Department (HOSPITAL_COMMUNITY)
Admission: EM | Admit: 2021-08-20 | Discharge: 2021-08-20 | Disposition: A | Discharge status: Home / Self Care | Payer: Medicaid Other | Attending: Emergency Medicine | Admitting: Emergency Medicine

## 2021-08-20 ENCOUNTER — Other Ambulatory Visit: Payer: Self-pay

## 2021-08-20 ENCOUNTER — Emergency Department (HOSPITAL_COMMUNITY): Payer: Medicaid Other

## 2021-08-20 DIAGNOSIS — I10 Essential (primary) hypertension: Secondary | ICD-10-CM | POA: Insufficient documentation

## 2021-08-20 DIAGNOSIS — B9789 Other viral agents as the cause of diseases classified elsewhere: Secondary | ICD-10-CM | POA: Insufficient documentation

## 2021-08-20 DIAGNOSIS — R110 Nausea: Secondary | ICD-10-CM | POA: Diagnosis not present

## 2021-08-20 DIAGNOSIS — J069 Acute upper respiratory infection, unspecified: Secondary | ICD-10-CM | POA: Insufficient documentation

## 2021-08-20 DIAGNOSIS — Z20822 Contact with and (suspected) exposure to covid-19: Secondary | ICD-10-CM | POA: Insufficient documentation

## 2021-08-20 DIAGNOSIS — R6883 Chills (without fever): Secondary | ICD-10-CM | POA: Diagnosis not present

## 2021-08-20 DIAGNOSIS — R0602 Shortness of breath: Secondary | ICD-10-CM | POA: Diagnosis not present

## 2021-08-20 DIAGNOSIS — R112 Nausea with vomiting, unspecified: Secondary | ICD-10-CM | POA: Insufficient documentation

## 2021-08-20 LAB — RESP PANEL BY RT-PCR (FLU A&B, COVID) ARPGX2
Influenza A by PCR: NEGATIVE
Influenza B by PCR: NEGATIVE
SARS Coronavirus 2 by RT PCR: NEGATIVE

## 2021-08-20 MED ORDER — DEXAMETHASONE 4 MG PO TABS
10.0000 mg | ORAL_TABLET | Freq: Once | ORAL | Status: AC
Start: 2021-08-20 — End: 2021-08-20
  Administered 2021-08-20: 10 mg via ORAL
  Filled 2021-08-20: qty 3

## 2021-08-20 MED ORDER — ALBUTEROL SULFATE HFA 108 (90 BASE) MCG/ACT IN AERS
2.0000 | INHALATION_SPRAY | RESPIRATORY_TRACT | Status: DC | PRN
  Filled 2021-08-20: qty 6.7

## 2021-08-20 MED ORDER — IPRATROPIUM-ALBUTEROL 0.5-2.5 (3) MG/3ML IN SOLN
3.0000 mL | Freq: Once | RESPIRATORY_TRACT | Status: AC
  Administered 2021-08-20: 3 mL via RESPIRATORY_TRACT
  Filled 2021-08-20: qty 3

## 2021-08-20 MED ORDER — PREDNISONE 50 MG PO TABS: 60.0000 mg | ORAL_TABLET | Freq: Once | ORAL | Status: DC

## 2021-08-20 MED ORDER — ONDANSETRON 8 MG PO TBDP
8.0000 mg | ORAL_TABLET | Freq: Once | ORAL | Status: DC
  Filled 2021-08-20: qty 1

## 2021-08-20 NOTE — ED Provider Notes (Signed)
[facility] EMERGENCY DEPARTMENT Provider Note   CSN: [id number] Arrival date & time: 08/20/21  [id number]     History  Chief Complaint  Patient presents with   Chills   Emesis    Shirley Thompson is a 46 y.o. female.  The history is provided by the patient.  Emesis She has history of hypertension, hyperlipidemia, GERD, and had onset about 1 AM of a feeling like there was a lot of mucus in her chest and she could not breathe.  This was followed by chills and nausea and vomiting.  Nausea is still present, but somewhat less.  She still feels chilled and still feels like there is a lot of mucus in her chest.  She denies any chest pain, tightness, pressure.  She denies any diarrhea.  She denies any sick contacts.  She states she is fully vaccinated against COVID and influenza.  She has not treated her symptoms at home.   Home Medications Prior to Admission medications   Medication Sig Start Date End Date Taking? Authorizing Provider  Aspirin-Salicylamide-Caffeine (BC HEADACHE) 325-95-16 MG TABS Take 1 packet by mouth daily as needed. Once taken as needed for pain    [provider]  cyclobenzaprine (FLEXERIL) 10 MG tablet Take 1 tablet (10 mg total) by mouth 2 (two) times daily as needed for muscle spasms. 04/25/21   Mesner, Barbara CowerJason, MD  dicyclomine (BENTYL) 20 MG tablet Take 1 tablet (20 mg total) by mouth 2 (two) times daily. 10/04/15   Horton, Mayer Maskerourtney F, MD  hydrochlorothiazide (HYDRODIURIL) 25 MG tablet Take 25 mg by mouth every morning.    [provider]  ibuprofen (ADVIL) 800 MG tablet Take 1 tablet (800 mg total) by mouth every 8 (eight) hours as needed. 08/02/21   Terrilee FilesButler, Michael C, MD  meclizine (ANTIVERT) 25 MG tablet Take 1 tablet (25 mg total) by mouth 3 (three) times daily as needed for dizziness. 07/23/14   Samuel JesterMcManus, Kathleen, DO  metoCLOPramide (REGLAN) 10 MG tablet Take 1 tablet (10 mg total) by mouth every 8 (eight) hours as needed for nausea. 04/11/16 04/11/17  Jennye MoccasinQuigley,  Brian S, MD  ondansetron (ZOFRAN ODT) 4 MG disintegrating tablet Take 1 tablet (4 mg total) by mouth every 8 (eight) hours as needed for nausea or vomiting. 07/23/14   Samuel JesterMcManus, Kathleen, DO  oxyCODONE-acetaminophen (PERCOCET/ROXICET) 5-325 MG tablet Take 1 tablet by mouth every 6 (six) hours as needed for severe pain. 08/02/21   Terrilee FilesButler, Michael C, MD  predniSONE (STERAPRED UNI-PAK 21 TAB) 10 MG (21) TBPK tablet Take by mouth daily. Take 6 tabs by mouth daily  for 2 days, then 5 tabs for 2 days, then 4 tabs for 2 days, then 3 tabs for 2 days, 2 tabs for 2 days, then 1 tab by mouth daily for 2 days 08/02/21   Terrilee FilesButler, Michael C, MD  traMADol (ULTRAM) 50 MG tablet Take 1 tablet (50 mg total) by mouth every 6 (six) hours as needed. 04/11/16   Jennye MoccasinQuigley, Brian S, MD      Allergies    Amlodipine, Azithromycin, Sulfa antibiotics, Amoxicillin, and Lisinopril    Review of Systems   Review of Systems  Gastrointestinal:  Positive for vomiting.  All other systems reviewed and are negative.  Physical Exam Updated Vital Signs BP 136/83 (BP Location: Right Arm)    Temp 98.3 F (36.8 C) (Oral)    Resp 18    Ht 5\' 4"  (1.626 m)    Wt 81.6 kg  LMP 07/24/2021 (Exact Date)    SpO2 100%    BMI 30.88 kg/m  Physical Exam Vitals and nursing note reviewed.  46 year old female, resting comfortably and in no acute distress. Vital signs are normal. Oxygen saturation is 100%, which is normal. Head is normocephalic and atraumatic. PERRLA, EOMI. Oropharynx is clear. Neck is nontender and supple without adenopathy or JVD. Back is nontender and there is no CVA tenderness. Lungs have faint expiratory wheezes without rales or rhonchi. Chest is nontender. Heart has regular rate and rhythm without murmur. Abdomen is soft, flat, with mild to moderate epigastric tenderness.  There is no rebound or guarding.  Peristalsis is hypoactive. Extremities have no cyanosis or edema, full range of motion is present. Skin is warm and dry  without rash. Neurologic: Mental status is normal, cranial nerves are intact, moves all extremities equally.  ED Results / Procedures / Treatments   Labs (all labs ordered are listed, but only abnormal results are displayed) Labs Reviewed  RESP PANEL BY RT-PCR (FLU A&B, COVID) [id number]   Radiology DG Chest [facility] 1 View  Result Date: 08/20/2021 CLINICAL DATA:  Shortness of breath, chills, emesis and nausea. EXAM: PORTABLE CHEST 1 VIEW COMPARISON:  08/16/2013 FINDINGS: Heart size and mediastinal contours are normal. There is no pleural effusion or edema identified. No airspace opacities. Visualized osseous structures are unremarkable. IMPRESSION: No active disease. Electronically Signed   By: Signa Kell M.D.   On: 08/20/2021 05:35    Procedures Procedures    Medications Ordered in ED Medications  ondansetron (ZOFRAN-ODT) disintegrating tablet 8 mg (8 mg Oral Not Given 08/20/21 0619)  albuterol (VENTOLIN HFA) 108 (90 Base) MCG/ACT inhaler 2 puff (has no administration in time range)  dexamethasone (DECADRON) tablet 10 mg (has no administration in time range)  ipratropium-albuterol (DUONEB) 0.5-2.5 (3) MG/3ML nebulizer solution 3 mL (3 mLs Nebulization Given 08/20/21 2703)    ED Course/ Medical Decision Making/ A&P                           Medical Decision Making Amount and/or Complexity of Data Reviewed Radiology: ordered.  Risk Prescription drug management.   Chills, wheezing, nausea and vomiting strongly suggestive of viral illness such as influenza or COVID-19.  Patient is resting comfortably and in no distress, will give albuterol with ipratropium and check chest x-ray to rule out pneumonia.  We will also check respiratory pathogen panel.  Old records are reviewed, and she has no relevant past visits.  Chest x-ray shows no evidence of pneumonia.  I have independently viewed the image, and agree with the radiologist's interpretation.  Respiratory pathogen panel is negative for  influenza and COVID.  Following albuterol and ipratropium nebulizer, patient felt moderately improved.  On reexam, lungs are completely clear.  She is given an albuterol inhaler to take home.  I was going to prescribe her prednisone, but she states that she had prednisone leftover from a recent ED visit for back pain.  On review of old records, she was seen on 08/01/2021 and given a prednisone taper.  So instead of prednisone, she is given a dose of dexamethasone and advised that she can supplement with her leftover prednisone if symptoms seem to be returning when the dexamethasone wears off.  Return precautions are discussed.        Final Clinical Impression(s) / ED Diagnoses Final diagnoses:  Viral URI with cough  Nausea and vomiting, unspecified vomiting type  Rx / DC Orders ED Discharge Orders     None         Dione Booze, MD 08/20/21 [phone]

## 2021-08-20 NOTE — ED Triage Notes (Signed)
Pt complains of nausea and vomiting that started around 0100 this morning that is followed by chills. Nausea/vomiting subsided at this time.

## 2021-08-20 NOTE — Discharge Instructions (Signed)
Please use the inhaler-2 puffs every 4 hours-as needed for any difficulty breathing, coughing, or tight feeling in your chest.  Take acetaminophen and/or ibuprofen as needed if you develop a fever or aching.  Return if symptoms or not being adequately controlled at home.

## 2021-08-23 DIAGNOSIS — M7070 Other bursitis of hip, unspecified hip: Secondary | ICD-10-CM | POA: Diagnosis not present

## 2021-09-03 ENCOUNTER — Other Ambulatory Visit: Payer: Self-pay

## 2021-09-03 ENCOUNTER — Emergency Department (HOSPITAL_COMMUNITY)
Admission: EM | Admit: 2021-09-03 | Discharge: 2021-09-03 | Disposition: A | Discharge status: Home / Self Care | Payer: Medicaid Other | Attending: Emergency Medicine | Admitting: Emergency Medicine

## 2021-09-03 ENCOUNTER — Encounter (HOSPITAL_COMMUNITY): Payer: Self-pay | Admitting: Emergency Medicine

## 2021-09-03 ENCOUNTER — Emergency Department (HOSPITAL_COMMUNITY): Payer: Medicaid Other

## 2021-09-03 DIAGNOSIS — Z7982 Long term (current) use of aspirin: Secondary | ICD-10-CM | POA: Insufficient documentation

## 2021-09-03 DIAGNOSIS — R109 Unspecified abdominal pain: Secondary | ICD-10-CM | POA: Diagnosis not present

## 2021-09-03 DIAGNOSIS — R9431 Abnormal electrocardiogram [ECG] [EKG]: Secondary | ICD-10-CM | POA: Diagnosis not present

## 2021-09-03 DIAGNOSIS — R1013 Epigastric pain: Secondary | ICD-10-CM | POA: Insufficient documentation

## 2021-09-03 DIAGNOSIS — I700 Atherosclerosis of aorta: Secondary | ICD-10-CM | POA: Diagnosis not present

## 2021-09-03 DIAGNOSIS — R1110 Vomiting, unspecified: Secondary | ICD-10-CM | POA: Diagnosis not present

## 2021-09-03 DIAGNOSIS — R112 Nausea with vomiting, unspecified: Secondary | ICD-10-CM | POA: Diagnosis not present

## 2021-09-03 DIAGNOSIS — I10 Essential (primary) hypertension: Secondary | ICD-10-CM | POA: Insufficient documentation

## 2021-09-03 LAB — COMPREHENSIVE METABOLIC PANEL
ALT: 16 U/L (ref 0–44)
AST: 16 U/L (ref 15–41)
Albumin: 4.1 g/dL (ref 3.5–5.0)
Alkaline Phosphatase: 85 U/L (ref 38–126)
Anion gap: 9 (ref 5–15)
BUN: 10 mg/dL (ref 6–20)
CO2: 24 mmol/L (ref 22–32)
Calcium: 9.2 mg/dL (ref 8.9–10.3)
Chloride: 104 mmol/L (ref 98–111)
Creatinine, Ser: 1.06 mg/dL — ABNORMAL HIGH (ref 0.44–1.00)
GFR, Estimated: >60 mL/min (ref >60)
Glucose, Bld: 109 mg/dL — ABNORMAL HIGH (ref 70–99)
Potassium: 4.5 mmol/L (ref 3.5–5.1)
Sodium: 137 mmol/L (ref 135–145)
Total Bilirubin: 0.3 mg/dL (ref 0.3–1.2)
Total Protein: 6.8 g/dL (ref 6.5–8.1)

## 2021-09-03 LAB — CBC
HCT: 42.7 % (ref 36.0–46.0)
Hemoglobin: 14.2 g/dL (ref 12.0–15.0)
MCH: 27.7 pg (ref 26.0–34.0)
MCHC: 33.3 g/dL (ref 30.0–36.0)
MCV: 83.2 fL (ref 80.0–100.0)
Platelets: 194 10*3/uL (ref 150–400)
RBC: 5.13 MIL/uL — ABNORMAL HIGH (ref 3.87–5.11)
RDW: 13.7 % (ref 11.5–15.5)
WBC: 15.3 10*3/uL — ABNORMAL HIGH (ref 4.0–10.5)
nRBC: 0 % (ref 0.0–0.2)

## 2021-09-03 LAB — URINALYSIS, ROUTINE W REFLEX MICROSCOPIC
Bilirubin Urine: NEGATIVE
Glucose, UA: NEGATIVE mg/dL
Hgb urine dipstick: NEGATIVE
Ketones, ur: NEGATIVE mg/dL
Leukocytes,Ua: NEGATIVE
Nitrite: NEGATIVE
Protein, ur: NEGATIVE mg/dL
Specific Gravity, Urine: <1.005 — ABNORMAL LOW (ref 1.005–1.030)
pH: 5.5 (ref 5.0–8.0)

## 2021-09-03 LAB — LIPASE, BLOOD: Lipase: 31 U/L (ref 11–51)

## 2021-09-03 LAB — I-STAT BETA HCG BLOOD, ED (MC, WL, AP ONLY): I-stat hCG, quantitative: <5 m[IU]/mL (ref <5)

## 2021-09-03 MED ORDER — FAMOTIDINE IN NACL 20-0.9 MG/50ML-% IV SOLN
20.0000 mg | Freq: Once | INTRAVENOUS | Status: AC
Start: 2021-09-03 — End: 2021-09-03
  Administered 2021-09-03: 20 mg via INTRAVENOUS
  Filled 2021-09-03: qty 50

## 2021-09-03 MED ORDER — METOCLOPRAMIDE HCL 5 MG/ML IJ SOLN
10.0000 mg | INTRAMUSCULAR | Status: AC
  Administered 2021-09-03: 10 mg via INTRAVENOUS
  Filled 2021-09-03: qty 2

## 2021-09-03 MED ORDER — ALUM & MAG HYDROXIDE-SIMETH 200-200-20 MG/5ML PO SUSP
30.0000 mL | Freq: Once | ORAL | Status: AC
  Administered 2021-09-03: 30 mL via ORAL
  Filled 2021-09-03: qty 30

## 2021-09-03 MED ORDER — SODIUM CHLORIDE 0.9 % IV BOLUS
1000.0000 mL | Freq: Once | INTRAVENOUS | Status: AC
  Administered 2021-09-03: 1000 mL via INTRAVENOUS

## 2021-09-03 MED ORDER — LIDOCAINE VISCOUS HCL 2 % MT SOLN
15.0000 mL | Freq: Once | OROMUCOSAL | Status: AC
Start: 2021-09-03 — End: 2021-09-03
  Administered 2021-09-03: 15 mL via ORAL
  Filled 2021-09-03: qty 15

## 2021-09-03 MED ORDER — IOHEXOL 300 MG/ML  SOLN
100.0000 mL | Freq: Once | INTRAMUSCULAR | Status: AC | PRN
  Administered 2021-09-03: 100 mL via INTRAVENOUS

## 2021-09-03 NOTE — ED Provider Notes (Signed)
[facility] EMERGENCY DEPARTMENT Provider Note   CSN: [id number] Arrival date & time: 09/03/21  [id number]     History  Chief Complaint  Patient presents with   Epigastric Pain /Gastric Reflux and Emesis    Shirley Thompson is a 46 y.o. female.  46 year old female with a history of hypertension, hyperlipidemia, esophageal reflux presents to the emergency department for evaluation of epigastric pain.  States that her pain has been constant this evening and associated with nausea and multiple episodes of vomiting.  She has had similar symptoms in the past and was told she had a hiatal hernia.  She has been taking Prilosec for the past 4 months without symptomatic improvement.  Does report some early satiety as well as bloating after eating.  Denies any radiation of her pain or associated fevers, hematemesis, melena, hematochezia, diarrhea, constipation.  Abdominal surgical history significant for C-section, tubal ligation, cholecystectomy.  The history is provided by the patient. No language interpreter was used.      Home Medications Prior to Admission medications   Medication Sig Start Date End Date Taking? Authorizing Provider  Aspirin-Salicylamide-Caffeine (BC HEADACHE) 325-95-16 MG TABS Take 1 packet by mouth daily as needed. Once taken as needed for pain    [provider]  cyclobenzaprine (FLEXERIL) 10 MG tablet Take 1 tablet (10 mg total) by mouth 2 (two) times daily as needed for muscle spasms. 04/25/21   Mesner, Corene Cornea, MD  dicyclomine (BENTYL) 20 MG tablet Take 1 tablet (20 mg total) by mouth 2 (two) times daily. 10/04/15   Horton, Barbette Hair, MD  hydrochlorothiazide (HYDRODIURIL) 25 MG tablet Take 25 mg by mouth every morning.    [provider]  ibuprofen (ADVIL) 800 MG tablet Take 1 tablet (800 mg total) by mouth every 8 (eight) hours as needed. 08/02/21   Hayden Rasmussen, MD  meclizine (ANTIVERT) 25 MG tablet Take 1 tablet (25 mg total) by mouth 3  (three) times daily as needed for dizziness. 07/23/14   Francine Graven, DO  metoCLOPramide (REGLAN) 10 MG tablet Take 1 tablet (10 mg total) by mouth every 8 (eight) hours as needed for nausea. 04/11/16 04/11/17  Daymon Larsen, MD  ondansetron (ZOFRAN ODT) 4 MG disintegrating tablet Take 1 tablet (4 mg total) by mouth every 8 (eight) hours as needed for nausea or vomiting. 07/23/14   Francine Graven, DO  oxyCODONE-acetaminophen (PERCOCET/ROXICET) 5-325 MG tablet Take 1 tablet by mouth every 6 (six) hours as needed for severe pain. 08/02/21   Hayden Rasmussen, MD  predniSONE (STERAPRED UNI-PAK 21 TAB) 10 MG (21) TBPK tablet Take by mouth daily. Take 6 tabs by mouth daily  for 2 days, then 5 tabs for 2 days, then 4 tabs for 2 days, then 3 tabs for 2 days, 2 tabs for 2 days, then 1 tab by mouth daily for 2 days 08/02/21   Hayden Rasmussen, MD  traMADol (ULTRAM) 50 MG tablet Take 1 tablet (50 mg total) by mouth every 6 (six) hours as needed. 04/11/16   Daymon Larsen, MD      Allergies    Amlodipine, Azithromycin, Sulfa antibiotics, Amoxicillin, and Lisinopril    Review of Systems   Review of Systems Ten systems reviewed and are negative for acute change, except as noted in the HPI.    Physical Exam Updated Vital Signs BP (!) 187/96    Pulse 82    Temp 98.8 F (37.1 C) (Oral)    Resp 16  SpO2 100%   Physical Exam Vitals and nursing note reviewed.  Constitutional:      General: She is not in acute distress.    Appearance: She is well-developed. She is not diaphoretic.     Comments: Nontoxic appearing   HENT:     Head: Normocephalic and atraumatic.  Eyes:     General: No scleral icterus.    Conjunctiva/sclera: Conjunctivae normal.  Pulmonary:     Effort: Pulmonary effort is normal. No respiratory distress.     Breath sounds: No stridor. No wheezing.     Comments: Respirations even and unlabored. Lungs CTAB. Abdominal:     General: There is no distension.     Palpations: Abdomen  is soft. There is no mass.     Tenderness: There is abdominal tenderness (epigastric).     Comments: Abdomen soft, nondistended. No peritoneal signs.  Musculoskeletal:        General: Normal range of motion.     Cervical back: Normal range of motion.  Skin:    General: Skin is warm and dry.     Coloration: Skin is not pale.     Findings: No erythema or rash.  Neurological:     Mental Status: She is alert and oriented to person, place, and time.     Coordination: Coordination normal.  Psychiatric:        Behavior: Behavior normal.    ED Results / Procedures / Treatments   Labs (all labs ordered are listed, but only abnormal results are displayed) Labs Reviewed  COMPREHENSIVE METABOLIC PANEL - Abnormal; Notable for the following components:      Result Value   Glucose, Bld 109 (*)    Creatinine, Ser 1.06 (*)    All other components within normal limits  CBC - Abnormal; Notable for the following components:   WBC 15.3 (*)    RBC 5.13 (*)    All other components within normal limits  URINALYSIS, ROUTINE W REFLEX MICROSCOPIC - Abnormal; Notable for the following components:   Specific Gravity, Urine <1.005 (*)    All other components within normal limits  LIPASE, BLOOD  I-STAT BETA HCG BLOOD, ED ([facility], [facility], [facility] ONLY)    EKG None  Radiology No results found.  Procedures Procedures    Medications Ordered in ED Medications  metoCLOPramide (REGLAN) injection 10 mg (has no administration in time range)  sodium chloride 0.9 % bolus 1,000 mL (has no administration in time range)  famotidine (PEPCID) IVPB 20 mg premix (has no administration in time range)  alum & mag hydroxide-simeth (MAALOX/MYLANTA) 200-200-20 MG/5ML suspension 30 mL (has no administration in time range)    And  lidocaine (XYLOCAINE) 2 % viscous mouth solution 15 mL (has no administration in time range)    ED Course/ Medical Decision Making/ A&P Clinical Course as of 09/03/21 0615  Sat Sep 03, 2021  0550  Patient noted to have leukocytosis of 15.3.  This is nonspecific.  No electrolyte derangements.  LFTs preserved.  No concern for retained gallstone.  Urinalysis negative for UTI.  Pending CT abdomen/pelvis for further evaluation. [KH]    Clinical Course User Index [KH] Antonietta Breach, PA-C                           Medical Decision Making Amount and/or Complexity of Data Reviewed Labs: ordered. Radiology: ordered.  Risk OTC drugs. Prescription drug management.   46 year old female with self-reported history of esophageal reflux and  hiatal hernia presenting for ongoing epigastric pain with 3-4 episodes of vomiting prior to arrival.  Initially afebrile, hypertensive.  She has a nonspecific leukocytosis of 15.4.  This may be from the stress of persistent vomiting, though unable to exclude infectious etiology.  No electrolyte derangements.  Liver and kidney function preserved.  Normal lipase, excluding pancreatitis.  The patient does have a history of cholecystectomy.  Her labs today do not suggest choledocholithiasis.  She is pending CT abdomen/pelvis for further evaluation of her symptoms.  Care signed out to Loeffler, PA-C at change of shift.         Final Clinical Impression(s) / ED Diagnoses Final diagnoses:  Epigastric abdominal pain    Rx / DC Orders ED Discharge Orders     None         Antonietta Breach, PA-C 09/03/21 0617    Fatima Blank, MD 09/03/21 [phone]

## 2021-09-03 NOTE — ED Triage Notes (Signed)
Patient reports persistent epigastric pain with gastric reflux and emesis .

## 2021-09-03 NOTE — ED Notes (Signed)
Provided pt water and crackers, as well as blanket.

## 2021-09-03 NOTE — ED Provider Notes (Signed)
Accepted handoff at shift change from [place], [place]. Please see prior provider note for more detail.   Briefly: "Patient is 46 y.o.  female with a history of hypertension, hyperlipidemia, esophageal reflux presents to the emergency department for evaluation of epigastric pain.  States that her pain has been constant this evening and associated with nausea and multiple episodes of vomiting.  She has had similar symptoms in the past and was told she had a hiatal hernia.  She has been taking Prilosec for the past 4 months without symptomatic improvement.  Does report some early satiety as well as bloating after eating.  Denies any radiation of her pain or associated fevers, hematemesis, melena, hematochezia, diarrhea, constipation.  Abdominal surgical history significant for C-section, tubal ligation, cholecystectomy."  DDX: concern for intraabdominal pathology versus GERD  Plan: f/u on CT scan, Reassessment, PO challenge    Physical Exam  BP (!) 143/96    Pulse 71    Temp 98.8 F (37.1 C) (Oral)    Resp 18    SpO2 99%   Physical Exam Vitals and nursing note reviewed.  Constitutional:      General: She is not in acute distress.    Appearance: Normal appearance. She is well-developed. She is not ill-appearing, toxic-appearing or diaphoretic.  HENT:     Head: Normocephalic and atraumatic.     Nose: No nasal deformity.     Mouth/Throat:     Lips: Pink. No lesions.  Eyes:     General: Gaze aligned appropriately. No scleral icterus.       Right eye: No discharge.        Left eye: No discharge.     Conjunctiva/sclera: Conjunctivae normal.     Right eye: Right conjunctiva is not injected. No exudate or hemorrhage.    Left eye: Left conjunctiva is not injected. No exudate or hemorrhage. Pulmonary:     Effort: Pulmonary effort is normal. No respiratory distress.  Abdominal:     General: Abdomen is flat. There is no distension.     Palpations: Abdomen is soft.     Tenderness: There is no  abdominal tenderness. There is no guarding or rebound.  Skin:    General: Skin is warm and dry.  Neurological:     Mental Status: She is alert and oriented to person, place, and time.  Psychiatric:        Mood and Affect: Mood normal.        Speech: Speech normal.        Behavior: Behavior normal. Behavior is cooperative.    Procedures  Procedures  ED Course / MDM   Clinical Course as of 09/03/21 0826  Sat Sep 03, 2021  0550 Patient noted to have leukocytosis of 15.3.  This is nonspecific.  No electrolyte derangements.  LFTs preserved.  No concern for retained gallstone.  Urinalysis negative for UTI.  Pending CT abdomen/pelvis for further evaluation. [KH]  0640 Reflux sx, ongoing recurrent epigastric pain, early satiety, boating, n/v, normal bm and flatus, no fevers or radiating sx, hx cholecystitis, pending CT to further evaluate cause of sx, GI cocktail ordered. Needs to tolerate PO, GI referral if negative.  [GL]    Clinical Course User Index [GL] Victorino Dike Finis Bud, PA-C [KH] Antony Madura, PA-C   Medical Decision Making Amount and/or Complexity of Data Reviewed External Data Reviewed: radiology.    Details: Reviewed most recent CT abdomen to compare fibroid Labs: ordered. Decision-making details documented in ED Course. Radiology: ordered and independent  interpretation performed. Decision-making details documented in ED Course. ECG/medicine tests: ordered and independent interpretation performed. Decision-making details documented in ED Course.  Risk OTC drugs. Prescription drug management.   This a patient that I received from handoff.  Briefly, patient has been having intermittent epigastric abdominal pain with associated nausea and vomiting over the past several months.  It sound like she was on a PPI for 3 months and recently quit taking this, however she experienced no relief from this.  She originally presented with the same symptoms today.  I reviewed all labs and  imaging.  The only notable finding is a white blood cell count of 15.3, however no other concerning infectious symptoms on exam or history.  This is likely nonspecific and may be secondary to the vomiting patient has been having..  Patient is status postcholecystectomy.  I doubt cholestatic episode given that there are no LFT derangements.  Electrolytes are intact.  Lipase is negative.  Kidney function stable.  Urinalysis shows no signs of infection.  EKG did not reveal any concerning symptoms to suggest ischemia or other cardiac cause to her symptoms.  I Followed up on CT abdomen pelvis.  There are no concerning findings that would explain patient's symptoms.  It did reveal that she does have a uterine fibroid that has been present since 2018.  It revealed that this has been enlarging.  I doubt that this is the cause of her symptoms, however it could be causing some compression on her bowels and making reflux symptoms worse.  She should follow-up with OB/GYN regarding the change in her fibroid.  Otherwise, CT abdomen pelvis was not really revealing.  Patient was given GI cocktail as well as IV fluids by previous provider team.  On reassessment, symptoms have resolved.  Her repeat abdominal exam was reassuring.  Discussed these results with the patient and she agrees for GI follow-up as well as OB/GYN.  We discussed medication management of trialing Pepcid or Tums when she has these symptoms.  I do not really see the benefit of her continue her PPI given that she gave a 12-month trial without any symptom relief.  She can probably benefit from trying a different medication, however will defer to gastroenterology for further management.      Therese Sarah 09/03/21 7253    Tegeler, Canary Brim, MD 09/03/21 0930

## 2021-09-03 NOTE — Discharge Instructions (Addendum)
You have been seen in the ED for abdominal pain. Your CT image did not reveal anything that would explain your symptoms. It did show that you have an enlarging uterine fibroid. I can provide you with an OBGYN referral with Dr. Florian Buff. You would also benefit from a gastroenterology referral so you should call [facility] today and schedule an appointment.

## 2021-09-05 ENCOUNTER — Telehealth: Payer: Self-pay

## 2021-09-05 NOTE — Telephone Encounter (Signed)
Transition Care Management Unsuccessful Follow-up Telephone Call ° °Date of discharge and from where:  09/03/2021 from [facility] ° °Attempts:  1st Attempt ° °Reason for unsuccessful TCM follow-up call:  Left voice message ° ° ° °

## 2021-09-06 NOTE — Telephone Encounter (Signed)
Transition Care Management Follow-up Telephone Call Date of discharge and from where: 09/03/2021 from [facility] How have you been since you were released from the hospital? Patient stated that she is feeling some better.  Any questions or concerns? No  Items Reviewed: Did the pt receive and understand the discharge instructions provided? Yes  Medications obtained and verified? Yes  Other? No  Any new allergies since your discharge? No  Dietary orders reviewed? No Do you have support at home? Yes   Functional Questionnaire: (I = Independent and D = Dependent) ADLs: I  Bathing/Dressing- I  Meal Prep- I  Eating- I  Maintaining continence- I  Transferring/Ambulation- I  Managing Meds- I   Follow up appointments reviewed:  PCP Hospital f/u appt confirmed? No   Specialist Hospital f/u appt confirmed? No  Patient is calling [facility] for a follow up appt. Patient has follow up with OBGYN on 09/13/2021 Are transportation arrangements needed? No  If their condition worsens, is the pt aware to call PCP or go to the Emergency Dept.? Yes Was the patient provided with contact information for the PCP's office or ED? Yes Was to pt encouraged to call back with questions or concerns? Yes

## 2021-09-07 ENCOUNTER — Other Ambulatory Visit: Payer: Self-pay

## 2021-09-07 ENCOUNTER — Other Ambulatory Visit: Payer: Self-pay | Admitting: Obstetrics and Gynecology

## 2021-09-07 NOTE — Patient Outreach (Signed)
Medicaid Managed Care   Nurse Care Manager Note  09/07/2021 Name:  Shirley Thompson MRN:  [id number] DOB:  1975/08/20  Shirley Thompson is an 46 y.o. year old female who is a primary patient of Pcp, No.  The Medicaid Managed Care Coordination team was consulted for assistance with:    Chronic healthcare management needs,  management of tobacco use, HTN, anxiety, depression. HLD, reflux, uterine fibroids, ? Hiatal hernia, occasional dizziness and nausea, bulging disc and pinched nerve.   Shirley Thompson was given information about Medicaid Managed Care Coordination team services today. Shirley Thompson Patient agreed to services and verbal consent obtained.  Engaged with patient by telephone for initial visit in response to provider referral for case management and/or care coordination services.   Assessments/Interventions:  Review of past medical history, allergies, medications, health status, including review of consultants reports, laboratory and other test data, was performed as part of comprehensive evaluation and provision of chronic care management services.  SDOH (Social Determinants of Health) assessments and interventions performed: SDOH Interventions    Flowsheet Row Most Recent Value  SDOH Interventions   Intimate Partner Violence Interventions Intervention Not Indicated  Transportation Interventions Intervention Not Indicated       Care Plan  Allergies  Allergen Reactions   Amlodipine Itching   Azithromycin Other (See Comments)    Burns    Sulfa Antibiotics Other (See Comments)    Burning to tongue and hands   Amoxicillin Rash   Lisinopril Rash    Medications Reviewed Today     Reviewed by Danie Chandler, RN (Registered Nurse) on 09/07/21 at 1320  Med List Status: <None>   Medication Order Taking? Sig Documenting Provider Last Dose Status Informant  Aspirin-Salicylamide-Caffeine 325-95-16 MG TABS [id number] Yes Take 1 packet by mouth daily as needed. Once taken as needed for pain  [provider] Taking Active Self  cholecalciferol (VITAMIN D3) 25 MCG (1000 UNIT) tablet [id number] Yes Take 1,000 Units by mouth daily. [provider] Taking Active Self  cyclobenzaprine (FLEXERIL) 10 MG tablet [id number] No Take 1 tablet (10 mg total) by mouth 2 (two) times daily as needed for muscle spasms.  Patient not taking: Reported on 09/07/2021   Mesner, Barbara Cower, MD Not Taking Active   dicyclomine (BENTYL) 20 MG tablet [id number] No Take 1 tablet (20 mg total) by mouth 2 (two) times daily.  Patient not taking: Reported on 09/07/2021   Shon Baton, MD Not Taking Active   hydrochlorothiazide (HYDRODIURIL) 25 MG tablet [id number] Yes Take 25 mg by mouth every morning. [provider] Taking Active Self  ibuprofen (ADVIL) 800 MG tablet [id number] Yes Take 1 tablet (800 mg total) by mouth every 8 (eight) hours as needed. Terrilee Files, MD Taking Active   losartan (COZAAR) 25 MG tablet [id number] Yes Take 25 mg by mouth daily. [provider] Taking Active Self  meclizine (ANTIVERT) 25 MG tablet [id number] No Take 1 tablet (25 mg total) by mouth 3 (three) times daily as needed for dizziness.  Patient not taking: Reported on 09/07/2021   Samuel Jester, DO Not Taking Active   metoCLOPramide (REGLAN) 10 MG tablet [id number]  Take 1 tablet (10 mg total) by mouth every 8 (eight) hours as needed for nausea.  Patient taking differently: Take 10 mg by mouth every 8 (eight) hours as needed for nausea. taking   Jennye Moccasin, MD  Expired 04/11/17 2359 Self  ondansetron (ZOFRAN ODT) 4 MG disintegrating tablet [id number] No Take 1  tablet (4 mg total) by mouth every 8 (eight) hours as needed for nausea or vomiting.  Patient not taking: Reported on 09/07/2021   Samuel Jester, DO Not Taking Active   oxyCODONE-acetaminophen (PERCOCET/ROXICET) 5-325 MG tablet [id number] Yes Take 1 tablet by mouth every 6 (six) hours as needed for severe pain. Terrilee Files, MD Taking  Active   predniSONE (STERAPRED UNI-PAK 21 TAB) 10 MG (21) TBPK tablet [id number] No Take by mouth daily. Take 6 tabs by mouth daily  for 2 days, then 5 tabs for 2 days, then 4 tabs for 2 days, then 3 tabs for 2 days, 2 tabs for 2 days, then 1 tab by mouth daily for 2 days  Patient not taking: Reported on 09/07/2021   Terrilee Files, MD Not Taking Active   traMADol (ULTRAM) 50 MG tablet [id number] No Take 1 tablet (50 mg total) by mouth every 6 (six) hours as needed.  Patient not taking: Reported on 09/07/2021   Jennye Moccasin, MD Not Taking Active             There are no problems to display for this patient.   Conditions to be addressed/monitored per PCP order:   chronic healthcare management needs,  management of tobacco use, HTN, anxiety, depression. HLD, reflux, uterine fibroids, ? Hiatal hernia, occasional dizziness and nausea, bulging disc and pinched nerve.   Care Plan : RN Care Manager Plan of Care  Updates made by Danie Chandler, RN since 09/07/2021 12:00 AM     Problem: Chronic Disease Management and Care Coordination Needs   Priority: High     Long-Range Goal: Self-Management Plan Developed   Start Date: 09/07/2021  Expected End Date: 11/05/2021  This Visit's Progress: On track  Priority: Medium  Note:   Current Barriers:  Knowledge Deficits related to plan of care for management of tobacco use, HTN, anxiety, depression. HLD, reflux, uterine fibroids, ? Hiatal hernia, occasional dizziness and nausea, bulging disc and pinched nerve.  Care Coordination needs related to resources Chronic Disease Management support and education needs related to plan of care for management of tobacco use, HTN, anxiety, depression. HLD, reflux, uterine fibroids, ? Hiatal hernia, occasional dizziness and nausea, bulging disc and pinched nerve.   RNCM Clinical Goal(s):  Patient will Knowledge Deficits related to plan of care for management of tobacco use, HTN, anxiety, depression. HLD, reflux,  uterine fibroids, ? Hiatal hernia, occasional dizziness and nausea, bulging disc and pinched nerve.  Care Coordination needs related to dental resources. Chronic Disease Management support and education needs related to plan of care for management of tobacco use, HTN, anxiety, depression. HLD, reflux, uterine fibroids, ? Hiatal hernia, occasional dizziness and nausea, bulging disc and pinched nerve.   Interventions: Inter-disciplinary care team collaboration (see longitudinal plan of care) Evaluation of current treatment plan related to  self management and patient's adherence to plan as established by provider Patient given 1-800-QUIT NOW resources Collaborated with Pharmacy Pharmacy referral for medication review BSW referral for dental resources Collaborated with BSW    (Status:  )   Evaluation of current treatment plan related to management of tobacco use, HTN, anxiety, depression. HLD, reflux, uterine fibroids, ? Hiatal hernia, occasional dizziness and nausea, bulging disc and pinched nerve.  self-management and patient's adherence to plan as established by provider. Discussed plans with patient for ongoing care management follow up and provided patient with direct contact information for care management team Advised patient to schedule an appt with PCP, GI Reviewed  medications with patient and discussed Reviewed scheduled/upcoming provider appointments   Patient Goals/Self-Care Activities: Take all medications as prescribed Attend all scheduled provider appointments Call pharmacy for medication refills 3-7 days in advance of running out of medications Perform all self care activities independently  Perform IADL's (shopping, preparing meals, housekeeping, managing finances) independently Call provider office for new concerns or questions   Follow Up Plan:  The care management team will reach out to the patient again over the next 30 days.     Long-Range Goal: Establish Plan Of  Care for Chronic Disease Management Needs   Start Date: 09/07/2021  Expected End Date: 12/05/2021  Priority: High  Note:   Timeframe:  Long-Range Goal Priority:  High Start Date:        09/07/21                     Expected End Date:    ongoing                   Follow Up Date 10/05/21    - practice safe sex - schedule appointment for flu shot - schedule appointment for vaccines needed due to my age or health - schedule recommended health tests (blood work, mammogram, colonoscopy, pap test) - schedule and keep appointment for annual check-up    Why is this important?   Screening tests can find diseases early when they are easier to treat.  Your doctor or nurse will talk with you about which tests are important for you.  Getting shots for common diseases like the flu and shingles will help prevent them.      Follow Up:  Patient agrees to Care Plan and Follow-up.  Plan: The Managed Medicaid care management team will reach out to the patient again over the next 30 days. and The  Patient has been provided with contact information for the Managed Medicaid care management team and has been advised to call with any health related questions or concerns.  Date/time of next scheduled RN care management/care coordination outreach:  10/05/21 at 1230

## 2021-09-07 NOTE — Patient Instructions (Signed)
Hi Shirley Thompson, thank you for speaking with me today, have a great afternoon!  Shirley Thompson was given information about Medicaid Managed Care team care coordination services as a part of their [facility] Medicaid benefit. Shirley Thompson verbally consented to engagement with the [facility] Managed Care team.   If you are experiencing a medical emergency, please call 911 or report to your local emergency department or urgent care.   If you have a non-emergency medical problem during routine business hours, please contact your provider's office and ask to speak with a nurse.   For questions related to your [facility] health plan, please call: [phone] or go here:[name]  If you would like to schedule transportation through your [facility] plan, please call the following number at least 2 days in advance of your appointment: [phone].  Call the [facility] Crisis Line at [phone], at any time, 24 hours a day, 7 days a week. If you are in danger or need immediate medical attention call 911.  If you would like help to quit smoking, call 1-800-QUIT-NOW ((401)571-2215) OR Espaol: 1-855-Djelo-Ya (3-662-947-6546) o para ms informacin haga clic aqu or Text READY to 503-546 to register via text  Ms. Shirley Thompson - following are the goals we discussed in your visit today:   Goals Addressed    Timeframe:  Long-Range Goal Priority:  High Start Date:        09/07/21                     Expected End Date:    ongoing                   Follow Up Date 10/05/21    - practice safe sex - schedule appointment for flu shot - schedule appointment for vaccines needed due to my age or health - schedule recommended health tests (blood work, mammogram, colonoscopy, pap test) - schedule and keep appointment for annual check-up    Why is this important?   Screening tests can find diseases early when they are easier to treat.  Your doctor or nurse will talk with you about  which tests are important for you.  Getting shots for common diseases like the flu and shingles will help prevent them.    The patient verbalized understanding of instructions provided today and agreed to receive a mailed copy of patient instruction and/or educational materials.  The Managed Medicaid care management team will reach out to the patient again over the next 30 days.  The  Patient  has been provided with contact information for the Managed Medicaid care management team and has been advised to call with any health related questions or concerns.   Shirley Der RN, BSN [place]   [employer] - Managed Medicaid High Risk [phone].   Following is a copy of your plan of care:  Care Plan : RN Care Manager Plan of Care  Updates made by Danie Chandler, RN since 09/07/2021 12:00 AM     Problem: Chronic Disease Management and Care Coordination Needs   Priority: High     Long-Range Goal: Self-Management Plan Developed   Start Date: 09/07/2021  Expected End Date: 11/05/2021  This Visit's Progress: On track  Priority: Medium  Note:   Current Barriers:  Knowledge Deficits related to plan of care for management of tobacco use, HTN, anxiety, depression. HLD, reflux, uterine fibroids, ? Hiatal hernia, occasional dizziness and nausea, bulging disc and pinched nerve.  Care Coordination needs related to resources Chronic Disease Management support and education needs related to plan of care for management of tobacco use, HTN, anxiety, depression. HLD, reflux, uterine fibroids, ? Hiatal hernia, occasional dizziness and nausea, bulging disc and pinched nerve.   RNCM Clinical Goal(s):  Patient will Knowledge Deficits related to plan of care for management of tobacco use, HTN, anxiety, depression. HLD, reflux, uterine fibroids, ? Hiatal hernia, occasional dizziness and nausea, bulging disc and pinched nerve.  Care Coordination needs related to dental  resources. Chronic Disease Management support and education needs related to plan of care for management of tobacco use, HTN, anxiety, depression. HLD, reflux, uterine fibroids, ? Hiatal hernia, occasional dizziness and nausea, bulging disc and pinched nerve.   Interventions: Inter-disciplinary care team collaboration (see longitudinal plan of care) Evaluation of current treatment plan related to  self management and patient's adherence to plan as established by provider Patient given 1-800-QUIT NOW resources Collaborated with Pharmacy Pharmacy referral for medication review BSW referral for dental resources Collaborated with BSW    (Status:  )   Evaluation of current treatment plan related to management of tobacco use, HTN, anxiety, depression. HLD, reflux, uterine fibroids, ? Hiatal hernia, occasional dizziness and nausea, bulging disc and pinched nerve.  self-management and patient's adherence to plan as established by provider. Discussed plans with patient for ongoing care management follow up and provided patient with direct contact information for care management team Advised patient to schedule an appt with PCP, GI Reviewed medications with patient and discussed Reviewed scheduled/upcoming provider appointments   Patient Goals/Self-Care Activities: Take all medications as prescribed Attend all scheduled provider appointments Call pharmacy for medication refills 3-7 days in advance of running out of medications Perform all self care activities independently  Perform IADL's (shopping, preparing meals, housekeeping, managing finances) independently Call provider office for new concerns or questions   Follow Up Plan:  The care management team will reach out to the patient again over the next 30 days.

## 2021-09-12 ENCOUNTER — Other Ambulatory Visit: Payer: Self-pay

## 2021-09-12 NOTE — Patient Outreach (Signed)
Care Coordination  09/12/2021  Shirley Thompson May 02, 1976 [id number]   Medicaid Managed Care   Unsuccessful Outreach Note  09/12/2021 Name: Shirley Thompson MRN: [id number] DOB: 04-Apr-1976  Referred by: Pcp, No Reason for referral : High Risk Managed Medicaid (MM Social Work Unsuccessful telephone outreach)   An unsuccessful telephone outreach was attempted today. The patient was referred to the case management team for assistance with care management and care coordination.   Follow Up Plan: The care management team will reach out to the patient again over the next 7 days.   Mickel Fuchs, BSW, [place] Managed Medicaid Team  [phone]

## 2021-09-12 NOTE — Patient Instructions (Signed)
Visit Information  Ms. Shirley Thompson  - as a part of your Medicaid benefit, you are eligible for care management and care coordination services at no cost or copay. I was unable to reach you by phone today but would be happy to help you with your health related needs. Please feel free to call me @ [facility] number).   A member of the Managed Medicaid care management team will reach out to you again over the next 7 days.   Mickel Fuchs, BSW, [place] Managed Medicaid Team  [phone]

## 2021-09-15 ENCOUNTER — Other Ambulatory Visit: Payer: Self-pay

## 2021-09-15 ENCOUNTER — Emergency Department (HOSPITAL_COMMUNITY)
Admission: EM | Admit: 2021-09-15 | Discharge: 2021-09-16 | Disposition: A | Discharge status: Home / Self Care | Payer: Medicaid Other | Attending: Emergency Medicine | Admitting: Emergency Medicine

## 2021-09-15 ENCOUNTER — Encounter (HOSPITAL_COMMUNITY): Payer: Self-pay | Admitting: Emergency Medicine

## 2021-09-15 DIAGNOSIS — M5136 Other intervertebral disc degeneration, lumbar region: Secondary | ICD-10-CM | POA: Diagnosis not present

## 2021-09-15 DIAGNOSIS — M51369 Other intervertebral disc degeneration, lumbar region without mention of lumbar back pain or lower extremity pain: Secondary | ICD-10-CM

## 2021-09-15 DIAGNOSIS — Z7982 Long term (current) use of aspirin: Secondary | ICD-10-CM | POA: Diagnosis not present

## 2021-09-15 DIAGNOSIS — Z7952 Long term (current) use of systemic steroids: Secondary | ICD-10-CM | POA: Diagnosis not present

## 2021-09-15 DIAGNOSIS — M79605 Pain in left leg: Secondary | ICD-10-CM | POA: Diagnosis not present

## 2021-09-15 DIAGNOSIS — M5126 Other intervertebral disc displacement, lumbar region: Secondary | ICD-10-CM | POA: Diagnosis not present

## 2021-09-15 MED ORDER — PREDNISONE 10 MG (21) PO TBPK: ORAL_TABLET | Freq: Every day | ORAL | 0 refills | Status: DC

## 2021-09-15 MED ORDER — CYCLOBENZAPRINE HCL 10 MG PO TABS
10.0000 mg | ORAL_TABLET | Freq: Once | ORAL | Status: AC
  Administered 2021-09-15: 10 mg via ORAL
  Filled 2021-09-15: qty 1

## 2021-09-15 MED ORDER — KETOROLAC TROMETHAMINE 60 MG/2ML IM SOLN
60.0000 mg | Freq: Once | INTRAMUSCULAR | Status: AC
  Administered 2021-09-15: 60 mg via INTRAMUSCULAR
  Filled 2021-09-15: qty 2

## 2021-09-15 MED ORDER — PREDNISONE 50 MG PO TABS
60.0000 mg | ORAL_TABLET | Freq: Once | ORAL | Status: AC
  Administered 2021-09-15: 60 mg via ORAL
  Filled 2021-09-15: qty 1

## 2021-09-15 MED ORDER — CYCLOBENZAPRINE HCL 10 MG PO TABS: 10.0000 mg | ORAL_TABLET | Freq: Three times a day (TID) | ORAL | 0 refills | Status: DC | PRN

## 2021-09-15 NOTE — ED Provider Notes (Signed)
[facility] EMERGENCY DEPARTMENT Provider Note   CSN: [id number] Arrival date & time: 09/15/21  2108     History  Chief Complaint  Patient presents with   Leg Pain    Shirley Thompson is a 46 y.o. female.  Patient is a 46 year old female presenting with complaints of left leg pain.  She describes discomfort in the anterior aspect of the left leg that radiates down to her lower leg and foot.  This has been worsening over the past several days.  She was seen with similar complaints last month.  She had an MRI obtained showing bulging disks, but no surgical problem.  She was referred to neurosurgery who gave what sounds like a steroid injection.  This seemed to help for a short period of time, now her pain is returning.  She denies any bowel or bladder complaints.  She denies any new injury or trauma.  Pain is worse with ambulation.  There are no alleviating factors.  The history is provided by the patient.      Home Medications Prior to Admission medications   Medication Sig Start Date End Date Taking? Authorizing Provider  Aspirin-Salicylamide-Caffeine 325-95-16 MG TABS Take 1 packet by mouth daily as needed. Once taken as needed for pain    [provider]  cholecalciferol (VITAMIN D3) 25 MCG (1000 UNIT) tablet Take 1,000 Units by mouth daily.    [provider]  cyclobenzaprine (FLEXERIL) 10 MG tablet Take 1 tablet (10 mg total) by mouth 2 (two) times daily as needed for muscle spasms. Patient not taking: Reported on 09/07/2021 04/25/21   Mesner, Barbara Cower, MD  dicyclomine (BENTYL) 20 MG tablet Take 1 tablet (20 mg total) by mouth 2 (two) times daily. Patient not taking: Reported on 09/07/2021 10/04/15   Horton, Mayer Masker, MD  hydrochlorothiazide (HYDRODIURIL) 25 MG tablet Take 25 mg by mouth every morning.    [provider]  ibuprofen (ADVIL) 800 MG tablet Take 1 tablet (800 mg total) by mouth every 8 (eight) hours as needed. 08/02/21   Terrilee Files, MD  losartan  (COZAAR) 25 MG tablet Take 25 mg by mouth daily.    [provider]  meclizine (ANTIVERT) 25 MG tablet Take 1 tablet (25 mg total) by mouth 3 (three) times daily as needed for dizziness. Patient not taking: Reported on 09/07/2021 07/23/14   Samuel Jester, DO  metoCLOPramide (REGLAN) 10 MG tablet Take 1 tablet (10 mg total) by mouth every 8 (eight) hours as needed for nausea. Patient taking differently: Take 10 mg by mouth every 8 (eight) hours as needed for nausea. taking 04/11/16 04/11/17  Jennye Moccasin, MD  ondansetron (ZOFRAN ODT) 4 MG disintegrating tablet Take 1 tablet (4 mg total) by mouth every 8 (eight) hours as needed for nausea or vomiting. Patient not taking: Reported on 09/07/2021 07/23/14   Samuel Jester, DO  oxyCODONE-acetaminophen (PERCOCET/ROXICET) 5-325 MG tablet Take 1 tablet by mouth every 6 (six) hours as needed for severe pain. 08/02/21   Terrilee Files, MD  predniSONE (STERAPRED UNI-PAK 21 TAB) 10 MG (21) TBPK tablet Take by mouth daily. Take 6 tabs by mouth daily  for 2 days, then 5 tabs for 2 days, then 4 tabs for 2 days, then 3 tabs for 2 days, 2 tabs for 2 days, then 1 tab by mouth daily for 2 days Patient not taking: Reported on 09/07/2021 08/02/21   Terrilee Files, MD  traMADol (ULTRAM) 50 MG tablet Take 1 tablet (50  mg total) by mouth every 6 (six) hours as needed. Patient not taking: Reported on 09/07/2021 04/11/16   Jennye Moccasin, MD      Allergies    Amlodipine, Azithromycin, Sulfa antibiotics, Amoxicillin, and Lisinopril    Review of Systems   Review of Systems  All other systems reviewed and are negative.  Physical Exam Updated Vital Signs BP (!) 151/91    Pulse 75    Resp 19    Ht 5\' 4"  (1.626 m)    Wt 81.6 kg    SpO2 100%    BMI 30.88 kg/m  Physical Exam Vitals and nursing note reviewed.  Constitutional:      General: She is not in acute distress.    Appearance: Normal appearance. She is not ill-appearing.  HENT:     Head: Normocephalic  and atraumatic.  Pulmonary:     Effort: Pulmonary effort is normal.  Musculoskeletal:     Comments: There is no palpable abnormality or tenderness within the lumbar region.  Skin:    General: Skin is warm and dry.  Neurological:     General: No focal deficit present.     Mental Status: She is alert and oriented to person, place, and time.     Comments: DTRs are trace and symmetrical in the patellar and Achilles tendons bilaterally.  Strength is 5 out of 5, but walks with an antalgic gait.  There is no weakness and strength is 5 out of 5 in both lower extremities.  Sensation is equal in both lower extremities.    ED Results / Procedures / Treatments   Labs (all labs ordered are listed, but only abnormal results are displayed) Labs Reviewed - No data to display  EKG None  Radiology No results found.  Procedures Procedures    Medications Ordered in ED Medications  ketorolac (TORADOL) injection 60 mg (has no administration in time range)  cyclobenzaprine (FLEXERIL) tablet 10 mg (has no administration in time range)  predniSONE (DELTASONE) tablet 60 mg (has no administration in time range)    ED Course/ Medical Decision Making/ A&P  Patient presenting today with complaints of left leg pain.  I suspect a radicular etiology.  She does have multiple bulging disks on MRI performed last month.  She has seen neurosurgery for this.  Patient is having no bowel or bladder complaints, and no weakness, but does have difficulty with ambulation secondary to pain.  Strength is intact and there are no red flags that would suggest an emergent situation.  At this point, I feel as though patient can be discharged.  She will be prescribed prednisone, Flexeril, and is to follow-up with neurosurgery if not improving.  Final Clinical Impression(s) / ED Diagnoses Final diagnoses:  None    Rx / DC Orders ED Discharge Orders     None         , MD 09/15/21 2352

## 2021-09-15 NOTE — ED Triage Notes (Signed)
Pt here with c/o left leg pain that starts in the bend of her leg and shoots down. Pt has hx of bulging disc and pinched nerves in her back.

## 2021-09-15 NOTE — Discharge Instructions (Signed)
Begin taking prednisone as prescribed.  Take ibuprofen 600 mg every 6 hours as needed for pain.  Begin taking Flexeril as prescribed as needed for pain not relieved with ibuprofen.  Follow-up with your neurosurgeon if symptoms are not improving in the next week.

## 2021-09-16 MED ORDER — CYCLOBENZAPRINE HCL 10 MG PO TABS: 10.0000 mg | ORAL_TABLET | Freq: Three times a day (TID) | ORAL | 0 refills | Status: AC | PRN

## 2021-09-16 MED ORDER — PREDNISONE 10 MG (21) PO TBPK: ORAL_TABLET | Freq: Every day | ORAL | 0 refills | Status: DC

## 2021-09-19 ENCOUNTER — Telehealth: Payer: Self-pay

## 2021-09-19 NOTE — Telephone Encounter (Signed)
Transition Care Management Follow-up Telephone Call Date of discharge and from where: 09/16/2021 from [facility] How have you been since you were released from the hospital? Patient stated that she is feeling better.  Any questions or concerns? No  Items Reviewed: Did the pt receive and understand the discharge instructions provided? Yes  Medications obtained and verified? Yes  Other? No  Any new allergies since your discharge? No  Dietary orders reviewed? No Do you have support at home? Yes   Functional Questionnaire: (I = Independent and D = Dependent) ADLs: I  Bathing/Dressing- I  Meal Prep- I  Eating- I  Maintaining continence- I  Transferring/Ambulation- I  Managing Meds- I   Follow up appointments reviewed:  PCP Hospital f/u appt confirmed? No   Specialist Hospital f/u appt confirmed? No Patient is contacting Neurosurgeon for a follow up appt.  Are transportation arrangements needed? No  If their condition worsens, is the pt aware to call PCP or go to the Emergency Dept.? Yes Was the patient provided with contact information for the PCP's office or ED? Yes Was to pt encouraged to call back with questions or concerns? Yes

## 2021-09-21 ENCOUNTER — Telehealth: Payer: Self-pay

## 2021-09-23 ENCOUNTER — Ambulatory Visit: Payer: Self-pay

## 2021-09-23 ENCOUNTER — Ambulatory Visit: Payer: Medicaid Other | Admitting: Physician Assistant

## 2021-09-29 DIAGNOSIS — N920 Excessive and frequent menstruation with regular cycle: Secondary | ICD-10-CM | POA: Diagnosis not present

## 2021-09-29 DIAGNOSIS — D252 Subserosal leiomyoma of uterus: Secondary | ICD-10-CM | POA: Diagnosis not present

## 2021-09-29 NOTE — Progress Notes (Deleted)
? ? ? ?09/29/2021 ?Shirley Thompson ?[id number] ?[birth year] ? ? ?ASSESSMENT AND PLAN:  ?*** ?There are no diagnoses linked to this encounter. ? ? ?Patient Care Team: ?Pcp, No as PCP - General ?Craft, Lorel Monaco, RN as Case Manager ? ?HISTORY OF PRESENT ILLNESS: ?46 y.o. female with a past medical history of lower back pain, hypertension and others listed below, presents for ER follow up on 09/03/21 for epigastric pain.  ?Patient went to ER with epigastric pain with nausea and vomiting. ?States it feels like previous episode with hiatal hernia. ?Had been on Prilosec for months without improvement. ?Patient had CT abdomen pelvis that showed no acute process, normal appendix, did show large pedunculated multilobe lobulated uterine mass present since 2018, getting larger. . ? ? ?External labs and notes reviewed this visit: ?CBC  09/03/2021  ?HGB 14.2 MCV 83.2 without evidence of anemia ?WBC 15.3 Platelets 194 ?Kidney function 09/03/2021  ?BUN 10 Cr 1.06  ?GFR >60  ?Potassium 4.5   ?LFTs 09/03/2021  ?AST 16 ALT 16 ?Alkphos 85 TBili 0.3 ?09/03/2021 LIPASE 31 ? ? ? ?Current Medications:  ? ?Current Outpatient Medications (Endocrine & Metabolic):  ?  predniSONE (STERAPRED UNI-PAK 21 TAB) 10 MG (21) TBPK tablet, Take by mouth daily. Take 6 tabs by mouth daily  for 2 days, then 5 tabs for 2 days, then 4 tabs for 2 days, then 3 tabs for 2 days, 2 tabs for 2 days, then 1 tab by mouth daily for 2 days ? ?Current Outpatient Medications (Cardiovascular):  ?  hydrochlorothiazide (HYDRODIURIL) 25 MG tablet, Take 25 mg by mouth every morning. ?  losartan (COZAAR) 25 MG tablet, Take 25 mg by mouth daily. ? ? ?Current Outpatient Medications (Analgesics):  ?  Aspirin-Salicylamide-Caffeine 123456 MG TABS, Take 1 packet by mouth daily as needed. Once taken as needed for pain ?  ibuprofen (ADVIL) 800 MG tablet, Take 1 tablet (800 mg total) by mouth every 8 (eight) hours as needed. ?  oxyCODONE-acetaminophen (PERCOCET/ROXICET) 5-325 MG tablet, Take 1  tablet by mouth every 6 (six) hours as needed for severe pain. ?  traMADol (ULTRAM) 50 MG tablet, Take 1 tablet (50 mg total) by mouth every 6 (six) hours as needed. (Patient not taking: Reported on 09/07/2021) ? ? ?Current Outpatient Medications (Other):  ?  cholecalciferol (VITAMIN D3) 25 MCG (1000 UNIT) tablet, Take 1,000 Units by mouth daily. ?  cyclobenzaprine (FLEXERIL) 10 MG tablet, Take 1 tablet (10 mg total) by mouth 3 (three) times daily as needed for muscle spasms. ?  dicyclomine (BENTYL) 20 MG tablet, Take 1 tablet (20 mg total) by mouth 2 (two) times daily. (Patient not taking: Reported on 09/07/2021) ?  meclizine (ANTIVERT) 25 MG tablet, Take 1 tablet (25 mg total) by mouth 3 (three) times daily as needed for dizziness. (Patient not taking: Reported on 09/07/2021) ?  metoCLOPramide (REGLAN) 10 MG tablet, Take 1 tablet (10 mg total) by mouth every 8 (eight) hours as needed for nausea. (Patient taking differently: Take 10 mg by mouth every 8 (eight) hours as needed for nausea. taking) ?  ondansetron (ZOFRAN ODT) 4 MG disintegrating tablet, Take 1 tablet (4 mg total) by mouth every 8 (eight) hours as needed for nausea or vomiting. (Patient not taking: Reported on 09/07/2021) ? ?Medical History:  ?Past Medical History:  ?Diagnosis Date  ? Anxiety   ? Chronic back pain   ? Depression   ? GERD (gastroesophageal reflux disease)   ? Headache   ? Hypercholesteremia   ?  Hypertension   ? ?Allergies:  ?Allergies  ?Allergen Reactions  ? Amlodipine Itching  ? Azithromycin Other (See Comments)  ?  Burns ?  ? Sulfa Antibiotics Other (See Comments)  ?  Burning to tongue and hands  ? Amoxicillin Rash  ? Lisinopril Rash  ?  ? ?Surgical History:  ?She  has a past surgical history that includes Tubal ligation and Cholecystectomy. ?Family History:  ?Her family history is not on file. ?Social History:  ? reports that she has been smoking cigarettes. She has been smoking an average of .5 packs per day. She has never used smokeless  tobacco. She reports that she does not drink alcohol and does not use drugs. ? ?REVIEW OF SYSTEMS  : All other systems reviewed and negative except where noted in the History of Present Illness. ? ? ?PHYSICAL EXAM: ?There were no vitals taken for this visit. ?General:   Pleasant, well developed female in no acute distress ?Head:  Normocephalic and atraumatic. ?Eyes: {sclerae:26738},conjunctive {conjuctiva:26739}  ?Heart:  {HEART EXAM HEM/ONC:21750} ?Pulm: Clear anteriorly; no wheezing ?Abdomen:  {BlankSingle:19197::"Distended","Ridged","Soft"}, {BlankSingle:19197::"Flat","Obese"} AB, skin exam {ABDOMEN SKIN EXAM:22649}, {BlankSingle:19197::"Absent","Hyperactive, tinkling","Hypoactive","Sluggish","Normal"} bowel sounds. {Desc; pc desc - abdomen tenderness:5168} tenderness {anatomy; site abdomen:5010}. {BlankMultiple:19196::"Without guarding","With guarding","Without rebound","With rebound"}, {Exam; abdomen organomegaly:15152}. ?Extremities:  {With/Without:304960234} edema. ?Msk:  Symmetrical without gross deformities. Peripheral pulses intact.  ?Neurologic:  Alert and  oriented x4;  grossly normal neurologically. ?Skin:   Dry and intact without significant lesions or rashes. ?Psychiatric: Demonstrates good judgement and reason without abnormal affect or behaviors. ? ? ?Vladimir Crofts, PA-C ?8:15 AM ? ? ?

## 2021-09-30 ENCOUNTER — Ambulatory Visit: Payer: Medicaid Other | Admitting: Physician Assistant

## 2021-10-01 ENCOUNTER — Encounter (HOSPITAL_COMMUNITY): Payer: Self-pay

## 2021-10-01 ENCOUNTER — Other Ambulatory Visit: Payer: Self-pay

## 2021-10-01 DIAGNOSIS — D72829 Elevated white blood cell count, unspecified: Secondary | ICD-10-CM | POA: Diagnosis not present

## 2021-10-01 DIAGNOSIS — R0789 Other chest pain: Secondary | ICD-10-CM | POA: Insufficient documentation

## 2021-10-01 DIAGNOSIS — N9489 Other specified conditions associated with female genital organs and menstrual cycle: Secondary | ICD-10-CM | POA: Diagnosis not present

## 2021-10-01 DIAGNOSIS — R1013 Epigastric pain: Secondary | ICD-10-CM | POA: Diagnosis not present

## 2021-10-01 DIAGNOSIS — Z20822 Contact with and (suspected) exposure to covid-19: Secondary | ICD-10-CM | POA: Diagnosis not present

## 2021-10-01 DIAGNOSIS — Z79899 Other long term (current) drug therapy: Secondary | ICD-10-CM | POA: Insufficient documentation

## 2021-10-01 DIAGNOSIS — F17200 Nicotine dependence, unspecified, uncomplicated: Secondary | ICD-10-CM | POA: Insufficient documentation

## 2021-10-01 DIAGNOSIS — Z7982 Long term (current) use of aspirin: Secondary | ICD-10-CM | POA: Insufficient documentation

## 2021-10-01 DIAGNOSIS — Z716 Tobacco abuse counseling: Secondary | ICD-10-CM | POA: Diagnosis not present

## 2021-10-01 DIAGNOSIS — I10 Essential (primary) hypertension: Secondary | ICD-10-CM | POA: Insufficient documentation

## 2021-10-01 NOTE — ED Triage Notes (Signed)
Right beast  pain that started today. Says it was intermittent but now it is more constant.  ?

## 2021-10-02 ENCOUNTER — Encounter (HOSPITAL_COMMUNITY): Payer: Self-pay | Admitting: Emergency Medicine

## 2021-10-02 ENCOUNTER — Emergency Department (HOSPITAL_COMMUNITY)
Admission: EM | Admit: 2021-10-02 | Discharge: 2021-10-02 | Disposition: A | Discharge status: Home / Self Care | Payer: Medicaid Other | Attending: Emergency Medicine | Admitting: Emergency Medicine

## 2021-10-02 ENCOUNTER — Emergency Department (HOSPITAL_COMMUNITY): Payer: Medicaid Other

## 2021-10-02 DIAGNOSIS — R030 Elevated blood-pressure reading, without diagnosis of hypertension: Secondary | ICD-10-CM

## 2021-10-02 DIAGNOSIS — R0789 Other chest pain: Secondary | ICD-10-CM

## 2021-10-02 LAB — COMPREHENSIVE METABOLIC PANEL
ALT: 12 U/L (ref 0–44)
AST: 13 U/L — ABNORMAL LOW (ref 15–41)
Albumin: 4 g/dL (ref 3.5–5.0)
Alkaline Phosphatase: 81 U/L (ref 38–126)
Anion gap: 7 (ref 5–15)
BUN: 16 mg/dL (ref 6–20)
CO2: 25 mmol/L (ref 22–32)
Calcium: 8.8 mg/dL — ABNORMAL LOW (ref 8.9–10.3)
Chloride: 104 mmol/L (ref 98–111)
Creatinine, Ser: 1 mg/dL (ref 0.44–1.00)
GFR, Estimated: >60 mL/min (ref >60)
Glucose, Bld: 97 mg/dL (ref 70–99)
Potassium: 4.4 mmol/L (ref 3.5–5.1)
Sodium: 136 mmol/L (ref 135–145)
Total Bilirubin: 0.2 mg/dL — ABNORMAL LOW (ref 0.3–1.2)
Total Protein: 7.1 g/dL (ref 6.5–8.1)

## 2021-10-02 LAB — CBC
HCT: 42.5 % (ref 36.0–46.0)
Hemoglobin: 13.7 g/dL (ref 12.0–15.0)
MCH: 27.6 pg (ref 26.0–34.0)
MCHC: 32.2 g/dL (ref 30.0–36.0)
MCV: 85.5 fL (ref 80.0–100.0)
Platelets: 201 10*3/uL (ref 150–400)
RBC: 4.97 MIL/uL (ref 3.87–5.11)
RDW: 14.5 % (ref 11.5–15.5)
WBC: 13.2 10*3/uL — ABNORMAL HIGH (ref 4.0–10.5)
nRBC: 0 % (ref 0.0–0.2)

## 2021-10-02 LAB — LIPASE, BLOOD: Lipase: 29 U/L (ref 11–51)

## 2021-10-02 LAB — URINALYSIS, ROUTINE W REFLEX MICROSCOPIC
Bilirubin Urine: NEGATIVE
Glucose, UA: NEGATIVE mg/dL
Ketones, ur: NEGATIVE mg/dL
Leukocytes,Ua: NEGATIVE
Nitrite: NEGATIVE
Protein, ur: NEGATIVE mg/dL
Specific Gravity, Urine: 1.008 (ref 1.005–1.030)
pH: 7 (ref 5.0–8.0)

## 2021-10-02 LAB — HCG, SERUM, QUALITATIVE: Preg, Serum: NEGATIVE

## 2021-10-02 LAB — RESP PANEL BY RT-PCR (FLU A&B, COVID) ARPGX2
Influenza A by PCR: NEGATIVE
Influenza B by PCR: NEGATIVE
SARS Coronavirus 2 by RT PCR: NEGATIVE

## 2021-10-02 LAB — TROPONIN I (HIGH SENSITIVITY)
Troponin I (High Sensitivity): <2 ng/L (ref <18)
Troponin I (High Sensitivity): 2 ng/L (ref <18)

## 2021-10-02 MED ORDER — IBUPROFEN 600 MG PO TABS: 600.0000 mg | ORAL_TABLET | Freq: Four times a day (QID) | ORAL | 0 refills | Status: AC | PRN

## 2021-10-02 MED ORDER — ALUM & MAG HYDROXIDE-SIMETH 200-200-20 MG/5ML PO SUSP
30.0000 mL | Freq: Once | ORAL | Status: AC
  Administered 2021-10-02: 30 mL via ORAL
  Filled 2021-10-02: qty 30

## 2021-10-02 MED ORDER — SODIUM CHLORIDE 0.9 % IV BOLUS: 1000.0000 mL | Freq: Once | INTRAVENOUS | Status: DC

## 2021-10-02 MED ORDER — LIDOCAINE 5 % EX PTCH: 1.0000 | MEDICATED_PATCH | Freq: Every day | CUTANEOUS | 0 refills | Status: AC | PRN

## 2021-10-02 MED ORDER — HYDROCODONE-ACETAMINOPHEN 5-325 MG PO TABS
1.0000 | ORAL_TABLET | Freq: Once | ORAL | Status: DC
  Filled 2021-10-02: qty 1

## 2021-10-02 MED ORDER — METHOCARBAMOL 500 MG PO TABS: 1000.0000 mg | ORAL_TABLET | Freq: Two times a day (BID) | ORAL | 0 refills | Status: AC

## 2021-10-02 MED ORDER — KETOROLAC TROMETHAMINE 60 MG/2ML IM SOLN
30.0000 mg | Freq: Once | INTRAMUSCULAR | Status: AC
Start: 2021-10-02 — End: 2021-10-02
  Administered 2021-10-02: 30 mg via INTRAMUSCULAR
  Filled 2021-10-02: qty 2

## 2021-10-02 MED ORDER — LIDOCAINE VISCOUS HCL 2 % MT SOLN
15.0000 mL | Freq: Once | OROMUCOSAL | Status: DC
  Filled 2021-10-02: qty 15

## 2021-10-02 MED ORDER — KETOROLAC TROMETHAMINE 15 MG/ML IJ SOLN: 15.0000 mg | Freq: Once | INTRAMUSCULAR | Status: DC

## 2021-10-02 MED ORDER — LIDOCAINE 5 % EX PTCH
1.0000 | MEDICATED_PATCH | CUTANEOUS | Status: DC
  Administered 2021-10-02: 1 via TRANSDERMAL
  Filled 2021-10-02: qty 1

## 2021-10-02 MED ORDER — FAMOTIDINE IN NACL 20-0.9 MG/50ML-% IV SOLN: 20.0000 mg | Freq: Once | INTRAVENOUS | Status: DC

## 2021-10-02 MED ORDER — FAMOTIDINE 20 MG PO TABS
20.0000 mg | ORAL_TABLET | Freq: Once | ORAL | Status: AC
  Administered 2021-10-02: 20 mg via ORAL
  Filled 2021-10-02: qty 1

## 2021-10-02 NOTE — ED Provider Notes (Signed)
[facility] EMERGENCY DEPARTMENT Provider Note   CSN: [id number] Arrival date & time: 10/01/21  2333     History  Chief Complaint  Patient presents with   Chest Pain    Shirley Thompson is a 46 y.o. female.  This is a 46 y.o. female with significant medical history as below, including anxiety, acid reflux, hypertension, hyperlipidemia who presents to the ED with complaint of chest wall pain.  Patient orts onset of discomfort yesterday morning.  Right lower chest wall, radiating to the left side chest wall.  Sometimes the epigastrium.  Symptom initially this morning lasted for few minutes resolve spontaneously; symptoms recurred this evening, mildly worsened.  No medications prior to arrival.  No nausea or vomiting.  No dyspnea, no palpitations.  No rashes, no trauma.  No history of DVT or PE, no leg swelling, no recent travel or sick contacts.  No recent medication or diet changes.  No stimulant use.  No illicit drug use   Past Medical History: No date: Anxiety No date: Chronic back pain No date: Depression No date: GERD (gastroesophageal reflux disease) No date: Headache No date: Hypercholesteremia No date: Hypertension  Past Surgical History: No date: CHOLECYSTECTOMY No date: TUBAL LIGATION    The history is provided by the patient. No language interpreter was used.  Chest Pain Associated symptoms: no abdominal pain, no cough, no dysphagia, no fever, no headache, no nausea, no palpitations, no shortness of breath and no vomiting    HPI: A 46 year old patient with a history of hypertension, hypercholesterolemia and obesity presents for evaluation of chest pain. Initial onset of pain was approximately 3-6 hours ago. The patient's chest pain is well-localized, is sharp and is not worse with exertion. The patient's chest pain is not middle- or left-sided, is not described as heaviness/pressure/tightness and does not radiate to the arms/jaw/neck. The patient does not complain of nausea  and denies diaphoresis. The patient has smoked in the past 90 days. The patient has no history of stroke, has no history of peripheral artery disease, denies any history of treated diabetes and has no relevant family history of coronary artery disease (first degree relative at less than age 44).   Home Medications Prior to Admission medications   Medication Sig Start Date End Date Taking? Authorizing Provider  ibuprofen (ADVIL) 600 MG tablet Take 1 tablet (600 mg total) by mouth every 6 (six) hours as needed. 10/02/21  Yes Tanda Rockers A, DO  lidocaine (LIDODERM) 5 % Place 1 patch onto the skin daily as needed. Remove & Discard patch within 12 hours or as directed by MD 10/02/21  Yes Sloan Leiter, DO  methocarbamol (ROBAXIN) 500 MG tablet Take 2 tablets (1,000 mg total) by mouth 2 (two) times daily for 5 days. 10/02/21 10/07/21 Yes Sloan Leiter, DO  Aspirin-Salicylamide-Caffeine [phone] MG TABS Take 1 packet by mouth daily as needed. Once taken as needed for pain    [provider]  cholecalciferol (VITAMIN D3) 25 MCG (1000 UNIT) tablet Take 1,000 Units by mouth daily.    [provider]  cyclobenzaprine (FLEXERIL) 10 MG tablet Take 1 tablet (10 mg total) by mouth 3 (three) times daily as needed for muscle spasms. 09/16/21   Geoffery Lyons, MD  dicyclomine (BENTYL) 20 MG tablet Take 1 tablet (20 mg total) by mouth 2 (two) times daily. Patient not taking: Reported on 09/07/2021 10/04/15   Horton, Mayer Masker, MD  hydrochlorothiazide (HYDRODIURIL) 25 MG tablet Take 25 mg by mouth every  morning.    [provider]  ibuprofen (ADVIL) 800 MG tablet Take 1 tablet (800 mg total) by mouth every 8 (eight) hours as needed. 08/02/21   Terrilee Files, MD  losartan (COZAAR) 25 MG tablet Take 25 mg by mouth daily.    [provider]  meclizine (ANTIVERT) 25 MG tablet Take 1 tablet (25 mg total) by mouth 3 (three) times daily as needed for dizziness. Patient not taking: Reported on  09/07/2021 07/23/14   Gia Lusher Jester, DO  metoCLOPramide (REGLAN) 10 MG tablet Take 1 tablet (10 mg total) by mouth every 8 (eight) hours as needed for nausea. Patient taking differently: Take 10 mg by mouth every 8 (eight) hours as needed for nausea. taking 04/11/16 04/11/17  Jennye Moccasin, MD  ondansetron (ZOFRAN ODT) 4 MG disintegrating tablet Take 1 tablet (4 mg total) by mouth every 8 (eight) hours as needed for nausea or vomiting. Patient not taking: Reported on 09/07/2021 07/23/14   Wolfgang Finigan Jester, DO  oxyCODONE-acetaminophen (PERCOCET/ROXICET) 5-325 MG tablet Take 1 tablet by mouth every 6 (six) hours as needed for severe pain. 08/02/21   Terrilee Files, MD  predniSONE (STERAPRED UNI-PAK 21 TAB) 10 MG (21) TBPK tablet Take by mouth daily. Take 6 tabs by mouth daily  for 2 days, then 5 tabs for 2 days, then 4 tabs for 2 days, then 3 tabs for 2 days, 2 tabs for 2 days, then 1 tab by mouth daily for 2 days 09/16/21   Geoffery Lyons, MD  traMADol (ULTRAM) 50 MG tablet Take 1 tablet (50 mg total) by mouth every 6 (six) hours as needed. Patient not taking: Reported on 09/07/2021 04/11/16   Jennye Moccasin, MD      Allergies    Amlodipine, Azithromycin, Sulfa antibiotics, Amoxicillin, and Lisinopril    Review of Systems   Review of Systems  Constitutional:  Negative for chills and fever.  HENT:  Negative for facial swelling and trouble swallowing.   Eyes:  Negative for photophobia and visual disturbance.  Respiratory:  Negative for cough and shortness of breath.   Cardiovascular:  Positive for chest pain. Negative for palpitations.  Gastrointestinal:  Negative for abdominal pain, nausea and vomiting.  Endocrine: Negative for polydipsia and polyuria.  Genitourinary:  Negative for difficulty urinating and hematuria.  Musculoskeletal:  Negative for gait problem and joint swelling.  Skin:  Negative for pallor and rash.  Neurological:  Negative for syncope and headaches.   Psychiatric/Behavioral:  Negative for agitation and confusion.    Physical Exam Updated Vital Signs BP (!) 189/110    Pulse 64    Temp 98.4 F (36.9 C)    Resp 15    Ht 5\' 4"  (1.626 m)    Wt 81.6 kg    SpO2 100%    BMI 30.88 kg/m  Physical Exam Vitals and nursing note reviewed.  Constitutional:      General: She is not in acute distress.    Appearance: Normal appearance. She is not ill-appearing, toxic-appearing or diaphoretic.  HENT:     Head: Normocephalic and atraumatic.     Right Ear: External ear normal.     Left Ear: External ear normal.     Nose: Nose normal.     Mouth/Throat:     Mouth: Mucous membranes are moist.  Eyes:     General: No scleral icterus.       Right eye: No discharge.        Left eye: No discharge.  Cardiovascular:     Rate and Rhythm: Normal rate and regular rhythm.     Pulses: Normal pulses.     Heart sounds: Normal heart sounds.  Pulmonary:     Effort: Pulmonary effort is normal. No respiratory distress.     Breath sounds: Normal breath sounds.  Chest:       Comments: No tenderness palpation.  No rash. Abdominal:     General: Abdomen is flat.     Palpations: Abdomen is soft.     Tenderness: There is no abdominal tenderness.  Musculoskeletal:        General: Normal range of motion.     Cervical back: Normal range of motion.     Right lower leg: No edema.     Left lower leg: No edema.  Skin:    General: Skin is warm and dry.     Capillary Refill: Capillary refill takes less than 2 seconds.  Neurological:     Mental Status: She is alert and oriented to person, place, and time.     GCS: GCS eye subscore is 4. GCS verbal subscore is 5. GCS motor subscore is 6.  Psychiatric:        Mood and Affect: Mood normal.        Behavior: Behavior normal.    ED Results / Procedures / Treatments   Labs (all labs ordered are listed, but only abnormal results are displayed) Labs Reviewed  CBC - Abnormal; Notable for the following components:       Result Value   WBC 13.2 (*)    All other components within normal limits  COMPREHENSIVE METABOLIC PANEL - Abnormal; Notable for the following components:   Calcium 8.8 (*)    AST 13 (*)    Total Bilirubin 0.2 (*)    All other components within normal limits  URINALYSIS, ROUTINE W REFLEX MICROSCOPIC - Abnormal; Notable for the following components:   Hgb urine dipstick LARGE (*)    All other components within normal limits  RESP PANEL BY RT-PCR (FLU A&B, COVID) ARPGX2  LIPASE, BLOOD  HCG, SERUM, QUALITATIVE  TROPONIN I (HIGH SENSITIVITY)  TROPONIN I (HIGH SENSITIVITY)    EKG EKG Interpretation  Date/Time:  Sunday October 02 2021 00:49:19 EST Ventricular Rate:  64 PR Interval:  139 QRS Duration: 96 QT Interval:  403 QTC Calculation: 416 R Axis:   65 Text Interpretation: Sinus rhythm Confirmed by Tanda RockersGray, Cully Luckow ([id number]) on 10/02/2021 4:45:34 AM  Radiology DG Chest Port 1 View  Result Date: 10/02/2021 CLINICAL DATA:  Chest pain on the right for 1 day, initial encounter EXAM: PORTABLE CHEST 1 VIEW COMPARISON:  08/20/2021 FINDINGS: The heart size and mediastinal contours are within normal limits. Both lungs are clear. The visualized skeletal structures are unremarkable. IMPRESSION: No active disease. Electronically Signed   By: Alcide CleverMark  Lukens M.D.   On: 10/02/2021 01:11    Procedures Procedures    Medications Ordered in ED Medications  alum & mag hydroxide-simeth (MAALOX/MYLANTA) 200-200-20 MG/5ML suspension 30 mL (30 mLs Oral Given 10/02/21 0151)    And  lidocaine (XYLOCAINE) 2 % viscous mouth solution 15 mL (15 mLs Oral Patient Refused/Not Given 10/02/21 0152)  lidocaine (LIDODERM) 5 % 1 patch (1 patch Transdermal Patch Applied 10/02/21 0229)  famotidine (PEPCID) tablet 20 mg (20 mg Oral Given 10/02/21 0214)  ketorolac (TORADOL) injection 30 mg (30 mg Intramuscular Given 10/02/21 0215)    ED Course/ Medical Decision Making/ A&P   HEAR Score: 2  Medical Decision  Making Amount and/or Complexity of Data Reviewed Labs: ordered. Radiology: ordered.  Risk OTC drugs. Prescription drug management.   Initial Impression and Ddx Serious etiology was considered, differential includes but not limited to ACS, anxiety, acid reflux, atypical chest pain, MSK, anxiety  Patient PMH that increases complexity of ED encounter: Hypertension, hyperlipidemia, anxiety  PERC negative, WELL's score is LOW; I have low suspicion for PE  Interpretation of Diagnostics I personally reviewed the EKG, Chest Xray, and Cardiac Monitoris CXR was non-acute. I agree with radiologist interpretation. Cardiac monitoring with NSR. EKG as above.  Normal sinus rhythm     CBC with leukocytosis, improved from prior.  Is afebrile.  Low suspicion for sepsis.  Labs otherwise are unremarkable.  Troponin negative x2, no ongoing chest pain, EKG without acute ischemia, chest x-ray negative.  Low risk heart score.  Patient Reassessment and Ultimate Disposition/Management   Patient has mildly elevated blood pressure reading, advised to follow PCP regarding this.  Did not take her evening medications for blood pressure  Counseled patient for approximately 3 minutes regarding smoking cessation. Discussed risks of smoking and how they applied and affected their visit here today. Patient not\ ready to quit at this time, however will follow up with their primary doctor when they are.   CPT code: 29528: intermediate counseling for smoking cessation   The patient's chest pain is not suggestive of pulmonary embolus, cardiac ischemia, aortic dissection, pericarditis, myocarditis, pulmonary embolism, pneumothorax, pneumonia, Zoster, or esophageal perforation, or other serious etiology.  Historically not abrupt in onset, tearing or ripping, pulses symmetric. EKG nonspecific for ischemia/infarction. No dysrhythmias, brugada, WPW, prolonged QT noted.   Troponin negative x2. CXR reviewed. Labs without  demonstration of acute pathology unless otherwise noted above. Low HEART Score: 0-3 points (0.9-1.7% risk of MACE).  Given the extremely low risk of these diagnoses further testing and evaluation for these possibilities does not appear to be indicated at this time. Patient in no distress and overall condition improved here in the ED. Detailed discussions were had with the patient regarding current findings, and need for close f/u with PCP or on call doctor. The patient has been instructed to return immediately if the symptoms worsen in any way for re-evaluation. Patient verbalized understanding and is in agreement with current care plan. All questions answered prior to discharge.   Patient management required discussion with the following services or consulting groups:  None  Complexity of Problems Addressed Acute illness or injury that poses threat of life of bodily function  Additional Data Reviewed and Analyzed Further history obtained from: Further history from spouse/family member, Past medical history and medications listed in the EMR, Prior ED visit notes, and Prior labs/imaging results  Patient Encounter Risk Assessment Prescriptions and Consideration of hospitalization         Final Clinical Impression(s) / ED Diagnoses Final diagnoses:  Atypical chest pain  Elevated blood pressure reading    Rx / DC Orders ED Discharge Orders          Ordered    methocarbamol (ROBAXIN) 500 MG tablet  2 times daily        10/02/21 0444    lidocaine (LIDODERM) 5 %  Daily PRN        10/02/21 0444    ibuprofen (ADVIL) 600 MG tablet  Every 6 hours PRN        10/02/21 0444              Sloan Leiter, DO  10/02/21 0448 ° °

## 2021-10-02 NOTE — Discharge Instructions (Addendum)
Please follow-up with your PCP regarding elevated blood pressure reading ? ? ?Return to the Emergency Department if you have unusual chest pain, pressure, or discomfort, shortness of breath, nausea, vomiting, burping, heartburn, tingling upper body parts, sweating, cold, clammy skin, or racing heartbeat. Call 911 if you think you are having a heart attack. Take all cardiac medications as prescribed - notify your doctor if you have any side effects. Follow cardiac diet - avoid fatty & fried foods, don't eat too much red meat, eat lots of fruits & vegetables, and dairy products should be low fat. Please lose weight if you are overweight. Become more active with walking, gardening, or any other activity that gets you to moving. ?  ?Please return to the emergency department immediately for any new or concerning symptoms, or if you get worse. ?

## 2021-10-02 NOTE — ED Notes (Signed)
2 successful IV attempts in left and right AC.. however pt states "it has never hurt this bad when I've had an IV" on the left AC. IV was removed upon her request. In the attempt in the right [facility] pt states that it feels like the "catheter is bending." IV removed from right [facility] per pt request.  ?

## 2021-10-03 ENCOUNTER — Telehealth: Payer: Self-pay

## 2021-10-03 NOTE — Telephone Encounter (Signed)
Transition Care Management Follow-up Telephone Call ?Date of discharge and from where: 10/02/2021-  ?How have you been since you were released from the hospital? Pt stated she is doing fine.  ?Any questions or concerns? No ? ?Items Reviewed: ?Did the pt receive and understand the discharge instructions provided? Yes  ?Medications obtained and verified? Yes  ?Other? No  ?Any new allergies since your discharge? No  ?Dietary orders reviewed? No ?Do you have support at home? Yes  ? ?Home Care and Equipment/Supplies: ?Were home health services ordered? not applicable ?If so, what is the name of the agency? N/A  ?Has the agency set up a time to come to the patient's home? not applicable ?Were any new equipment or medical supplies ordered?  No ?What is the name of the medical supply agency? N/A ?Were you able to get the supplies/equipment? not applicable ?Do you have any questions related to the use of the equipment or supplies? No ? ?Functional Questionnaire: (I = Independent and D = Dependent) ?ADLs: I ? ?Bathing/Dressing- I ? ?Meal Prep- I ? ?Eating- I ? ?Maintaining continence- I ? ?Transferring/Ambulation- I ? ?Managing Meds- I ? ?Follow up appointments reviewed: ? ?PCP Hospital f/u appt confirmed? No   ?Specialist Hospital f/u appt confirmed? No  . ?Are transportation arrangements needed? No  ?If their condition worsens, is the pt aware to call PCP or go to the Emergency Dept.? Yes ?Was the patient provided with contact information for the PCP's office or ED? Yes ?Was to pt encouraged to call back with questions or concerns? Yes  ?

## 2021-10-05 ENCOUNTER — Other Ambulatory Visit: Payer: Self-pay | Admitting: Obstetrics and Gynecology

## 2021-10-05 ENCOUNTER — Other Ambulatory Visit: Payer: Self-pay

## 2021-10-05 NOTE — Patient Instructions (Signed)
Hi Shirley Thompson you for speaking with me today, I hope you have a nice afternoon. ? ?Shirley Thompson was given information about Medicaid Managed Care team care coordination services as a part of their [facility] Medicaid benefit. Shirley Thompson verbally consented to engagement with the [facility] Managed Care team.  ? ?If you are experiencing a medical emergency, please call 911 or report to your local emergency department or urgent care.  ? ?If you have a non-emergency medical problem during routine business hours, please contact your provider's office and ask to speak with a nurse.  ? ?For questions related to your [facility] health plan, please call: [phone] or go here:[email or web address] ? ?If you would like to schedule transportation through your [facility] plan, please call the following number at least 2 days in advance of your appointment: [phone]. ? You can also use the MTM portal or MTM mobile app to manage your rides. For the portal, please go to [email or web address] ? ?Call the Behavioral Health Crisis Line at [phone], at any time, 24 hours a day, 7 days a week. If you are in danger or need immediate medical attention call 911. ? ?If you would like help to quit smoking, call 1-800-QUIT-NOW ((365)120-3717) OR Espa?ol: 1-855-D?jelo-Ya (386)840-3802) o para m?s informaci?n haga clic aqu? or Text READY to 200-400 to register via text ? ?Shirley Thompson - following are the goals we discussed in your visit today:  ? Goals Addressed   ? ?Timeframe:  Long-Range Goal ?Priority:  High ?Start Date:        09/07/21                     ?Expected End Date:    ongoing                  ? ?Follow Up Date 11/05/21 ?  ?- practice safe sex ?- schedule appointment for flu shot ?- schedule appointment for vaccines needed due to my age or health ?- schedule recommended health tests (blood work, mammogram, colonoscopy, pap test) ?- schedule and keep appointment for annual check-up  ?  ?Why is this important?    ?Screening tests can find diseases early when they are easier to treat.  ?Your doctor or nurse will talk with you about which tests are important for you.  ?Getting shots for common diseases like the flu and shingles will help prevent them.   ?10/05/21:  Patient to schedule an appt with PCP, GI, and neurosurgery.  Has appt with GYN 10/13/21 for EMB. ? ?Patient verbalizes understanding of instructions and care plan provided today and agrees to view in MyChart. Active MyChart status confirmed with patient.   ? ?The Managed Medicaid care management team will reach out to the patient again over the next 30 days.  ?The  Patient  has been provided with contact information for the Managed Medicaid care management team and has been advised to call with any health related questions or concerns.  ? ?Kathi Der RN, BSN ?[place]  [facility] ?Care Management Coordinator - Managed Medicaid High Risk ?[phone] ?  ?Following is a copy of your plan of care:  ?Care Plan : RN Care Manager Plan of Care  ?Updates made by Danie Chandler, RN since 10/05/2021 12:00 AM  ?  ? ?Problem: Chronic Disease Management and Care Coordination Needs   ?Priority: High  ?  ? ?Long-Range Goal: Self-Management Plan Developed   ?Start Date: 09/07/2021  ?Expected End Date: 11/05/2021  ?  Recent Progress: On track  ?Priority: Medium  ?Note:   ?Current Barriers:  ?Knowledge Deficits related to plan of care for management of tobacco use, HTN, anxiety, depression. HLD, reflux, uterine fibroids, ? Hiatal hernia, occasional dizziness and nausea, bulging disc and pinched nerve.  ?Care Coordination needs related to resources ?Chronic Disease Management support and education needs related to plan of care for management of tobacco use, HTN, anxiety, depression. HLD, reflux, uterine fibroids, ? Hiatal hernia, occasional dizziness and nausea, bulging disc and pinched nerve.  ?10/05/21:  Patient to schedule appointments with PCP, GI, and neurosurgery for  follow up.  Has appointment with GYN 10/13/21 for continued bleeding to have EMB.  Patient continues to smoke 1 ppd-reminded of resources.  Follow up appt scheduled with BSW for dental resources as patient unavailable during last attempt. ? ?RNCM Clinical Goal(s):  ?Patient will Knowledge Deficits related to plan of care for management of tobacco use, HTN, anxiety, depression. HLD, reflux, uterine fibroids, ? Hiatal hernia, occasional dizziness and nausea, bulging disc and pinched nerve.  ?Care Coordination needs related to dental resources. ?Chronic Disease Management support and education needs related to plan of care for management of tobacco use, HTN, anxiety, depression. HLD, reflux, uterine fibroids, ? Hiatal hernia, occasional dizziness and nausea, bulging disc and pinched nerve.  ? ?Interventions: ?Inter-disciplinary care team collaboration (see longitudinal plan of care) ?Evaluation of current treatment plan related to  self management and patient's adherence to plan as established by provider ?Patient given 1-800-QUIT NOW resources ?Collaborated with Pharmacy ?Pharmacy referral for medication review ?BSW referral for dental resources ?Collaborated with BSW ? ?  (Status:  )   ?Evaluation of current treatment plan related to management of tobacco use, HTN, anxiety, depression. HLD, reflux, uterine fibroids, ? Hiatal hernia, occasional dizziness and nausea, bulging disc and pinched nerve.  self-management and patient's adherence to plan as established by provider. ?Discussed plans with patient for ongoing care management follow up and provided patient with direct contact information for care management team ?Advised patient to schedule an appt with PCP, GI ?Reviewed medications with patient and discussed ?Reviewed scheduled/upcoming provider appointments  ? ?Patient Goals/Self-Care Activities: ?Take all medications as prescribed ?Attend all scheduled provider appointments ?Call pharmacy for medication refills  3-7 days in advance of running out of medications ?Perform all self care activities independently  ?Perform IADL's (shopping, preparing meals, housekeeping, managing finances) independently ?Call provider office for new concerns or questions  ? ?Follow Up Plan:  The care management team will reach out to the patient again over the next 30 days.   ?  ?

## 2021-10-05 NOTE — Patient Outreach (Signed)
Medicaid Managed Care   Nurse Care Manager Note  10/05/2021 Name:  Shirley Thompson MRN:  [id number] DOB:  08-19-1975  Shirley Thompson is an 46 y.o. year old female who is a primary patient of Pcp, No.  The Medicaid Managed Care Coordination team was consulted for assistance with:    Chronic healthcare management needs, tobacco use, HTN, anxiety/depression, HLD, reflux  Ms. Giarrusso was given information about Medicaid Managed Care Coordination team services today. Shirley Thompson Patient agreed to services and verbal consent obtained.  Engaged with patient by telephone for follow up visit in response to provider referral for case management and/or care coordination services.   Assessments/Interventions:  Review of past medical history, allergies, medications, health status, including review of consultants reports, laboratory and other test data, was performed as part of comprehensive evaluation and provision of chronic care management services.  SDOH (Social Determinants of Health) assessments and interventions performed: SDOH Interventions    Flowsheet Row Most Recent Value  SDOH Interventions   Stress Interventions Patient Refused      Care Plan  Allergies  Allergen Reactions   Amlodipine Itching   Azithromycin Other (See Comments)    Burns    Sulfa Antibiotics Other (See Comments)    Burning to tongue and hands   Amoxicillin Rash   Lisinopril Rash   Medications Reviewed Today     Reviewed by Danie Chandler, RN (Registered Nurse) on 10/05/21 at 1302  Med List Status: <None>   Medication Order Taking? Sig Documenting Provider Last Dose Status Informant  Aspirin-Salicylamide-Caffeine 325-95-16 MG TABS [id number] No Take 1 packet by mouth daily as needed. Once taken as needed for pain [provider] Taking Active Self  cholecalciferol (VITAMIN D3) 25 MCG (1000 UNIT) tablet [id number] No Take 1,000 Units by mouth daily. [provider] Taking Active Self  cyclobenzaprine  (FLEXERIL) 10 MG tablet [id number]  Take 1 tablet (10 mg total) by mouth 3 (three) times daily as needed for muscle spasms. Geoffery Lyons, MD  Active   dicyclomine (BENTYL) 20 MG tablet [id number] No Take 1 tablet (20 mg total) by mouth 2 (two) times daily.  Patient not taking: Reported on 09/07/2021   Shon Baton, MD Not Taking Active   hydrochlorothiazide (HYDRODIURIL) 25 MG tablet [id number] No Take 25 mg by mouth every morning. [provider] Taking Active Self  ibuprofen (ADVIL) 600 MG tablet [id number]  Take 1 tablet (600 mg total) by mouth every 6 (six) hours as needed. Sloan Leiter, DO  Active   ibuprofen (ADVIL) 800 MG tablet [id number] No Take 1 tablet (800 mg total) by mouth every 8 (eight) hours as needed. Terrilee Files, MD Taking Active   lidocaine (LIDODERM) 5 % [id number]  Place 1 patch onto the skin daily as needed. Remove & Discard patch within 12 hours or as directed by MD Sloan Leiter, DO  Active   losartan (COZAAR) 25 MG tablet [id number] No Take 25 mg by mouth daily. [provider] Taking Active Self  meclizine (ANTIVERT) 25 MG tablet [id number] No Take 1 tablet (25 mg total) by mouth 3 (three) times daily as needed for dizziness.  Patient not taking: Reported on 09/07/2021   Samuel Jester, DO Not Taking Active   methocarbamol (ROBAXIN) 500 MG tablet [id number]  Take 2 tablets (1,000 mg total) by mouth 2 (two) times daily for 5 days. Sloan Leiter, DO  Active   metoCLOPramide (REGLAN) 10 MG tablet [id number]  Take 1  tablet (10 mg total) by mouth every 8 (eight) hours as needed for nausea.  Patient taking differently: Take 10 mg by mouth every 8 (eight) hours as needed for nausea. taking   Jennye Moccasin, MD  Expired 04/11/17 2359 Self  ondansetron (ZOFRAN ODT) 4 MG disintegrating tablet [id number] No Take 1 tablet (4 mg total) by mouth every 8 (eight) hours as needed for nausea or vomiting.  Patient not taking: Reported on 09/07/2021   Samuel Jester,  DO Not Taking Active   oxyCODONE-acetaminophen (PERCOCET/ROXICET) 5-325 MG tablet [id number] No Take 1 tablet by mouth every 6 (six) hours as needed for severe pain. Terrilee Files, MD Taking Active   predniSONE (STERAPRED UNI-PAK 21 TAB) 10 MG (21) TBPK tablet [id number]  Take by mouth daily. Take 6 tabs by mouth daily  for 2 days, then 5 tabs for 2 days, then 4 tabs for 2 days, then 3 tabs for 2 days, 2 tabs for 2 days, then 1 tab by mouth daily for 2 days Geoffery Lyons, MD  Active   traMADol (ULTRAM) 50 MG tablet [id number] No Take 1 tablet (50 mg total) by mouth every 6 (six) hours as needed.  Patient not taking: Reported on 09/07/2021   Jennye Moccasin, MD Not Taking Active            There are no problems to display for this patient.  Conditions to be addressed/monitored per PCP order:  Chronic healthcare management needs, tobacco use, HTN, anxiety/depression, HLD, reflux  Care Plan : RN Care Manager Plan of Care  Updates made by Danie Chandler, RN since 10/05/2021 12:00 AM     Problem: Chronic Disease Management and Care Coordination Needs   Priority: High     Long-Range Goal: Self-Management Plan Developed   Start Date: 09/07/2021  Expected End Date: 11/05/2021  Recent Progress: On track  Priority: Medium  Note:   Current Barriers:  Knowledge Deficits related to plan of care for management of tobacco use, HTN, anxiety, depression. HLD, reflux, uterine fibroids, ? Hiatal hernia, occasional dizziness and nausea, bulging disc and pinched nerve.  Care Coordination needs related to resources Chronic Disease Management support and education needs related to plan of care for management of tobacco use, HTN, anxiety, depression. HLD, reflux, uterine fibroids, ? Hiatal hernia, occasional dizziness and nausea, bulging disc and pinched nerve.  10/05/21:  Patient to schedule appointments with PCP, GI, and neurosurgery for follow up.  Has appointment with GYN 10/13/21 for continued bleeding to have  EMB.  Patient continues to smoke 1 ppd-reminded of resources.  Follow up appt scheduled with BSW for dental resources as patient unavailable during last attempt.  RNCM Clinical Goal(s):  Patient will Knowledge Deficits related to plan of care for management of tobacco use, HTN, anxiety, depression. HLD, reflux, uterine fibroids, ? Hiatal hernia, occasional dizziness and nausea, bulging disc and pinched nerve.  Care Coordination needs related to dental resources. Chronic Disease Management support and education needs related to plan of care for management of tobacco use, HTN, anxiety, depression. HLD, reflux, uterine fibroids, ? Hiatal hernia, occasional dizziness and nausea, bulging disc and pinched nerve.   Interventions: Inter-disciplinary care team collaboration (see longitudinal plan of care) Evaluation of current treatment plan related to  self management and patient's adherence to plan as established by provider Patient given 1-800-QUIT NOW resources Collaborated with Pharmacy Pharmacy referral for medication review BSW referral for dental resources Collaborated with BSW    (Status:  )  Evaluation of current treatment plan related to management of tobacco use, HTN, anxiety, depression. HLD, reflux, uterine fibroids, ? Hiatal hernia, occasional dizziness and nausea, bulging disc and pinched nerve.  self-management and patient's adherence to plan as established by provider. Discussed plans with patient for ongoing care management follow up and provided patient with direct contact information for care management team Advised patient to schedule an appt with PCP, GI Reviewed medications with patient and discussed Reviewed scheduled/upcoming provider appointments   Patient Goals/Self-Care Activities: Take all medications as prescribed Attend all scheduled provider appointments Call pharmacy for medication refills 3-7 days in advance of running out of medications Perform all self care  activities independently  Perform IADL's (shopping, preparing meals, housekeeping, managing finances) independently Call provider office for new concerns or questions   Follow Up Plan:  The care management team will reach out to the patient again over the next 30 days.    Follow Up:  Patient agrees to Care Plan and Follow-up.  Plan: The Managed Medicaid care management team will reach out to the patient again over the next 30 days. and The  Patient has been provided with contact information for the Managed Medicaid care management team and has been advised to call with any health related questions or concerns.  Date/time of next scheduled RN care management/care coordination outreach:  11/02/21 at 0900

## 2021-10-06 ENCOUNTER — Other Ambulatory Visit: Payer: Self-pay

## 2021-10-06 NOTE — Patient Instructions (Signed)
Visit Information ? ?Ms. Leelah K Neyer  - as a part of your Medicaid benefit, you are eligible for care management and care coordination services at no cost or copay. I was unable to reach you by phone today but would be happy to help you with your health related needs. Please feel free to call me @ [phone] ?A member of the Managed Medicaid care management team will reach out to you again over the next 7 days.  ? ?Preslyn Warr, BSW, MHA ?[facility]  [place]  ?High Risk Managed Medicaid Team  ?[phone]  ?

## 2021-10-06 NOTE — Patient Outreach (Signed)
Care Coordination ? ?10/06/2021 ? ?[name] ?September 23, 1975 ?[id number] ? ? ?Medicaid Managed Care  ? ?Unsuccessful Outreach Note ? ?10/06/2021 ?Name: Shirley Thompson MRN: [id number] DOB: [birth year] ? ?Referred by: Pcp, No ?Reason for referral : High Risk Managed Medicaid (MM social work Soil scientist) ? ? ?A second unsuccessful telephone outreach was attempted today. The patient was referred to the case management team for assistance with care management and care coordination.  ? ?Follow Up Plan: The care management team will reach out to the patient again over the next 7 days.  ? ?Shirley Thompson, BSW, MHA ?[facility]  ?High Risk Managed Medicaid Team  ?[phone]  ?

## 2021-10-12 DIAGNOSIS — N920 Excessive and frequent menstruation with regular cycle: Secondary | ICD-10-CM | POA: Diagnosis not present

## 2021-10-15 ENCOUNTER — Encounter (HOSPITAL_COMMUNITY): Payer: Self-pay

## 2021-10-15 ENCOUNTER — Other Ambulatory Visit: Payer: Self-pay

## 2021-10-15 ENCOUNTER — Emergency Department (HOSPITAL_COMMUNITY)
Admission: EM | Admit: 2021-10-15 | Discharge: 2021-10-15 | Disposition: A | Discharge status: Home / Self Care | Payer: Medicaid Other | Attending: Emergency Medicine | Admitting: Emergency Medicine

## 2021-10-15 ENCOUNTER — Emergency Department (HOSPITAL_COMMUNITY): Payer: Medicaid Other

## 2021-10-15 DIAGNOSIS — R531 Weakness: Secondary | ICD-10-CM | POA: Diagnosis not present

## 2021-10-15 DIAGNOSIS — Z7982 Long term (current) use of aspirin: Secondary | ICD-10-CM | POA: Diagnosis not present

## 2021-10-15 DIAGNOSIS — R0789 Other chest pain: Secondary | ICD-10-CM | POA: Diagnosis not present

## 2021-10-15 DIAGNOSIS — R079 Chest pain, unspecified: Secondary | ICD-10-CM | POA: Diagnosis not present

## 2021-10-15 DIAGNOSIS — Z20822 Contact with and (suspected) exposure to covid-19: Secondary | ICD-10-CM | POA: Insufficient documentation

## 2021-10-15 DIAGNOSIS — H538 Other visual disturbances: Secondary | ICD-10-CM | POA: Insufficient documentation

## 2021-10-15 DIAGNOSIS — I10 Essential (primary) hypertension: Secondary | ICD-10-CM | POA: Insufficient documentation

## 2021-10-15 DIAGNOSIS — R200 Anesthesia of skin: Secondary | ICD-10-CM | POA: Insufficient documentation

## 2021-10-15 DIAGNOSIS — R42 Dizziness and giddiness: Secondary | ICD-10-CM | POA: Diagnosis not present

## 2021-10-15 DIAGNOSIS — Z79899 Other long term (current) drug therapy: Secondary | ICD-10-CM | POA: Insufficient documentation

## 2021-10-15 LAB — CBC
HCT: 42 % (ref 36.0–46.0)
Hemoglobin: 13.8 g/dL (ref 12.0–15.0)
MCH: 28 pg (ref 26.0–34.0)
MCHC: 32.9 g/dL (ref 30.0–36.0)
MCV: 85.4 fL (ref 80.0–100.0)
Platelets: 223 10*3/uL (ref 150–400)
RBC: 4.92 MIL/uL (ref 3.87–5.11)
RDW: 14.6 % (ref 11.5–15.5)
WBC: 9.7 10*3/uL (ref 4.0–10.5)
nRBC: 0 % (ref 0.0–0.2)

## 2021-10-15 LAB — I-STAT CHEM 8, ED
BUN: 8 mg/dL (ref 6–20)
Calcium, Ion: 1.14 mmol/L — ABNORMAL LOW (ref 1.15–1.40)
Chloride: 104 mmol/L (ref 98–111)
Creatinine, Ser: 1 mg/dL (ref 0.44–1.00)
Glucose, Bld: 93 mg/dL (ref 70–99)
HCT: 44 % (ref 36.0–46.0)
Hemoglobin: 15 g/dL (ref 12.0–15.0)
Potassium: 4.8 mmol/L (ref 3.5–5.1)
Sodium: 139 mmol/L (ref 135–145)
TCO2: 25 mmol/L (ref 22–32)

## 2021-10-15 LAB — COMPREHENSIVE METABOLIC PANEL
ALT: 13 U/L (ref 0–44)
AST: 16 U/L (ref 15–41)
Albumin: 3.6 g/dL (ref 3.5–5.0)
Alkaline Phosphatase: 83 U/L (ref 38–126)
Anion gap: 8 (ref 5–15)
BUN: 10 mg/dL (ref 6–20)
CO2: 26 mmol/L (ref 22–32)
Calcium: 8.9 mg/dL (ref 8.9–10.3)
Chloride: 105 mmol/L (ref 98–111)
Creatinine, Ser: 0.93 mg/dL (ref 0.44–1.00)
GFR, Estimated: >60 mL/min (ref >60)
Glucose, Bld: 98 mg/dL (ref 70–99)
Potassium: 4.6 mmol/L (ref 3.5–5.1)
Sodium: 139 mmol/L (ref 135–145)
Total Bilirubin: 0.4 mg/dL (ref 0.3–1.2)
Total Protein: 6.5 g/dL (ref 6.5–8.1)

## 2021-10-15 LAB — APTT: aPTT: 27 seconds (ref 24–36)

## 2021-10-15 LAB — URINALYSIS, ROUTINE W REFLEX MICROSCOPIC
Bilirubin Urine: NEGATIVE
Glucose, UA: NEGATIVE mg/dL
Hgb urine dipstick: NEGATIVE
Ketones, ur: NEGATIVE mg/dL
Leukocytes,Ua: NEGATIVE
Nitrite: NEGATIVE
Protein, ur: NEGATIVE mg/dL
Specific Gravity, Urine: 1.01 (ref 1.005–1.030)
pH: 7 (ref 5.0–8.0)

## 2021-10-15 LAB — TROPONIN I (HIGH SENSITIVITY)
Troponin I (High Sensitivity): 2 ng/L (ref <18)
Troponin I (High Sensitivity): <2 ng/L (ref <18)

## 2021-10-15 LAB — RESP PANEL BY RT-PCR (FLU A&B, COVID) ARPGX2
Influenza A by PCR: NEGATIVE
Influenza B by PCR: NEGATIVE
SARS Coronavirus 2 by RT PCR: NEGATIVE

## 2021-10-15 LAB — RAPID URINE DRUG SCREEN, HOSP PERFORMED
Amphetamines: NOT DETECTED
Barbiturates: NOT DETECTED
Benzodiazepines: NOT DETECTED
Cocaine: NOT DETECTED
Opiates: NOT DETECTED
Tetrahydrocannabinol: NOT DETECTED

## 2021-10-15 LAB — DIFFERENTIAL
Abs Immature Granulocytes: 0.03 10*3/uL (ref 0.00–0.07)
Basophils Absolute: 0 10*3/uL (ref 0.0–0.1)
Basophils Relative: 0 %
Eosinophils Absolute: 0.1 10*3/uL (ref 0.0–0.5)
Eosinophils Relative: 1 %
Immature Granulocytes: 0 %
Lymphocytes Relative: 30 %
Lymphs Abs: 2.9 10*3/uL (ref 0.7–4.0)
Monocytes Absolute: 0.6 10*3/uL (ref 0.1–1.0)
Monocytes Relative: 7 %
Neutro Abs: 6 10*3/uL (ref 1.7–7.7)
Neutrophils Relative %: 62 %

## 2021-10-15 LAB — PROTIME-INR
INR: 0.9 (ref 0.8–1.2)
Prothrombin Time: 12.2 seconds (ref 11.4–15.2)

## 2021-10-15 LAB — ETHANOL: Alcohol, Ethyl (B): <10 mg/dL (ref <10)

## 2021-10-15 LAB — POC URINE PREG, ED: Preg Test, Ur: NEGATIVE

## 2021-10-15 MED ORDER — DIAZEPAM 5 MG/ML IJ SOLN
5.0000 mg | Freq: Once | INTRAMUSCULAR | Status: AC
  Administered 2021-10-15: 5 mg via INTRAVENOUS
  Filled 2021-10-15: qty 2

## 2021-10-15 MED ORDER — LACTATED RINGERS IV BOLUS
1000.0000 mL | Freq: Once | INTRAVENOUS | Status: AC
  Administered 2021-10-15: 1000 mL via INTRAVENOUS

## 2021-10-15 NOTE — ED Triage Notes (Signed)
Patient states she was driving her daughter back from work when she felt numbness come over her body with left eye blurred vision and lessened sensation on the left. Patient seen via Dr. Doren Custard in triage and ruled out code stroke from evaluation.  ?

## 2021-10-15 NOTE — ED Provider Notes (Addendum)
?[facility] EMERGENCY DEPARTMENT ?Provider Note ? ? ?CSN: [id number] ?Arrival date & time: 10/15/21  0720 ? ?  ? ?History ? ?Chief Complaint  ?Patient presents with  ? Numbness  ? Blurred Vision  ? ? ?Shirley Thompson is a 46 y.o. female. ? ?HPI ?Patient presents for transient episode of dizziness, full body numbness and blurred vision.  This occurred at approximately 6:30 AM.  At the time, she was driving her car.  She states that she felt normal when she woke up this morning.  She had initial blurriness in both eyes followed by increased blurriness in left eye.  Currently, she denies any sensation of numbness or paresthesias.  She does endorse a burning sensation in her chest. ?  ? ?Home Medications ?Prior to Admission medications   ?Medication Sig Start Date End Date Taking? Authorizing Provider  ?Aspirin-Salicylamide-Caffeine 325-95-16 MG TABS Take 1 packet by mouth daily as needed. Once taken as needed for pain    [provider]  ?cholecalciferol (VITAMIN D3) 25 MCG (1000 UNIT) tablet Take 1,000 Units by mouth daily.    [provider]  ?cyclobenzaprine (FLEXERIL) 10 MG tablet Take 1 tablet (10 mg total) by mouth 3 (three) times daily as needed for muscle spasms. 09/16/21   Geoffery Lyonselo, Douglas, MD  ?dicyclomine (BENTYL) 20 MG tablet Take 1 tablet (20 mg total) by mouth 2 (two) times daily. ?Patient not taking: Reported on 09/07/2021 10/04/15   Horton, Mayer Maskerourtney F, MD  ?hydrochlorothiazide (HYDRODIURIL) 25 MG tablet Take 25 mg by mouth every morning.    [provider]  ?ibuprofen (ADVIL) 600 MG tablet Take 1 tablet (600 mg total) by mouth every 6 (six) hours as needed. 10/02/21   Sloan LeiterGray, Samuel A, DO  ?ibuprofen (ADVIL) 800 MG tablet Take 1 tablet (800 mg total) by mouth every 8 (eight) hours as needed. 08/02/21   Terrilee FilesButler, Michael C, MD  ?lidocaine (LIDODERM) 5 % Place 1 patch onto the skin daily as needed. Remove & Discard patch within 12 hours or as directed by MD 10/02/21   Sloan LeiterGray, Samuel A, DO  ?losartan  (COZAAR) 25 MG tablet Take 25 mg by mouth daily.    [provider]  ?meclizine (ANTIVERT) 25 MG tablet Take 1 tablet (25 mg total) by mouth 3 (three) times daily as needed for dizziness. ?Patient not taking: Reported on 09/07/2021 07/23/14   Samuel JesterMcManus, Kathleen, DO  ?metoCLOPramide (REGLAN) 10 MG tablet Take 1 tablet (10 mg total) by mouth every 8 (eight) hours as needed for nausea. ?Patient taking differently: Take 10 mg by mouth every 8 (eight) hours as needed for nausea. taking 04/11/16 04/11/17  Jennye MoccasinQuigley, Brian S, MD  ?ondansetron (ZOFRAN ODT) 4 MG disintegrating tablet Take 1 tablet (4 mg total) by mouth every 8 (eight) hours as needed for nausea or vomiting. ?Patient not taking: Reported on 09/07/2021 07/23/14   Samuel JesterMcManus, Kathleen, DO  ?oxyCODONE-acetaminophen (PERCOCET/ROXICET) 5-325 MG tablet Take 1 tablet by mouth every 6 (six) hours as needed for severe pain. 08/02/21   Terrilee FilesButler, Michael C, MD  ?predniSONE (STERAPRED UNI-PAK 21 TAB) 10 MG (21) TBPK tablet Take by mouth daily. Take 6 tabs by mouth daily  for 2 days, then 5 tabs for 2 days, then 4 tabs for 2 days, then 3 tabs for 2 days, 2 tabs for 2 days, then 1 tab by mouth daily for 2 days 09/16/21   Geoffery Lyonselo, Douglas, MD  ?traMADol (ULTRAM) 50 MG tablet Take 1 tablet (50 mg total) by mouth every  6 (six) hours as needed. ?Patient not taking: Reported on 09/07/2021 04/11/16   Jennye Moccasin, MD  ?   ? ?Allergies    ?Amlodipine, Azithromycin, Penicillins, Sulfa antibiotics, Amoxicillin, and Lisinopril   ? ?Review of Systems   ?Review of Systems  ?Eyes:  Positive for visual disturbance.  ?Cardiovascular:  Positive for chest pain.  ?Neurological:  Positive for dizziness and numbness.  ?All other systems reviewed and are negative. ? ?Physical Exam ?Updated Vital Signs ?BP (!) 159/91   Pulse 62   Temp 98 ?F (36.7 ?C)   Resp 20   Ht 5\' 4"  (1.626 m)   Wt 82.5 kg   LMP 10/05/2021   SpO2 100%   BMI 31.21 kg/m?  ?Physical Exam ?Vitals and nursing note reviewed.   ?Constitutional:   ?   General: She is not in acute distress. ?   Appearance: Normal appearance. She is well-developed and normal weight. She is not ill-appearing, toxic-appearing or diaphoretic.  ?HENT:  ?   Head: Normocephalic and atraumatic.  ?   Right Ear: External ear normal.  ?   Left Ear: External ear normal.  ?   Nose: Nose normal.  ?   Mouth/Throat:  ?   Mouth: Mucous membranes are moist.  ?   Pharynx: Oropharynx is clear.  ?Eyes:  ?   General: No visual field deficit or scleral icterus. ?   Extraocular Movements: Extraocular movements intact.  ?   Conjunctiva/sclera: Conjunctivae normal.  ?Cardiovascular:  ?   Rate and Rhythm: Normal rate and regular rhythm.  ?   Heart sounds: No murmur heard. ?Pulmonary:  ?   Effort: Pulmonary effort is normal. No respiratory distress.  ?Abdominal:  ?   General: There is no distension.  ?   Palpations: Abdomen is soft.  ?Musculoskeletal:     ?   General: No swelling. Normal range of motion.  ?   Cervical back: Normal range of motion and neck supple. No rigidity.  ?   Right lower leg: No edema.  ?   Left lower leg: No edema.  ?Skin: ?   General: Skin is warm and dry.  ?   Coloration: Skin is not jaundiced or pale.  ?Neurological:  ?   Mental Status: She is alert.  ?   Cranial Nerves: Cranial nerves 2-12 are intact. No cranial nerve deficit, dysarthria or facial asymmetry.  ?   Motor: Motor function is intact. No weakness, abnormal muscle tone or pronator drift.  ?   Coordination: Coordination is intact. Finger-Nose-Finger Test normal.  ?   Comments: 20/20 vision bilaterally ?Endorses slight diminished sensation in left hemibody and left face  ?Psychiatric:     ?   Mood and Affect: Mood normal.     ?   Behavior: Behavior normal.  ? ? ?ED Results / Procedures / Treatments   ?Labs ?(all labs ordered are listed, but only abnormal results are displayed) ?Labs Reviewed  ?I-STAT CHEM 8, ED - Abnormal; Notable for the following components:  ?    Result Value  ? Calcium, Ion 1.14  (*)   ? All other components within normal limits  ?RESP PANEL BY RT-PCR (FLU A&B, COVID) ARPGX2  ?ETHANOL  ?PROTIME-INR  ?CBC  ?DIFFERENTIAL  ?COMPREHENSIVE METABOLIC PANEL  ?RAPID URINE DRUG SCREEN, HOSP PERFORMED  ?URINALYSIS, ROUTINE W REFLEX MICROSCOPIC  ?APTT  ?POC URINE PREG, ED  ?TROPONIN I (HIGH SENSITIVITY)  ?TROPONIN I (HIGH SENSITIVITY)  ? ? ?EKG ?EKG Interpretation ? ?Date/Time:  Saturday  October 15 2021 07:38:33 EDT ?Ventricular Rate:  68 ?PR Interval:  131 ?QRS Duration: 87 ?QT Interval:  379 ?QTC Calculation: 403 ?R Axis:   50 ?Text Interpretation: Sinus rhythm Confirmed by Gloris Manchester [phone]) on 10/15/2021 8:17:32 AM ? ?Radiology ?DG Chest Portable 1 View ? ?Result Date: 10/15/2021 ?CLINICAL DATA:  Chest pain and dizziness.  Weakness. EXAM: PORTABLE CHEST 1 VIEW COMPARISON:  10/02/2021 FINDINGS: Normal heart size. No pleural effusion or edema identified. No airspace consolidation identified. The visualized osseous structures are unremarkable. IMPRESSION: No acute cardiopulmonary abnormalities. Electronically Signed   By: Signa Kell M.D.   On: 10/15/2021 08:17   ? ?Procedures ?Procedures  ? ? ?Medications Ordered in ED ?Medications  ?lactated ringers bolus 1,000 mL (0 mLs Intravenous Stopped 10/15/21 1001)  ?diazepam (VALIUM) injection 5 mg (5 mg Intravenous Given 10/15/21 0804)  ? ? ?ED Course/ Medical Decision Making/ A&P ?  ?                        ?Medical Decision Making ?Amount and/or Complexity of Data Reviewed ?Labs: ordered. ?Radiology: ordered. ? ?Risk ?Prescription drug management. ? ? ?This patient presents to the ED for concern of numbness and blurry vision, this involves an extensive number of treatment options, and is a complaint that carries with it a high risk of complications and morbidity.  The differential diagnosis includes CVA, TIA, seizure, anxiety, metabolic abnormalities, ACS, aortic dissection ? ? ?Co morbidities that complicate the patient evaluation ? ?Anxiety, chronic back  pain, depression, GERD, HLD, HTN ? ? ?Additional history obtained: ? ?Additional history obtained from N/A ?External records from outside source obtained and reviewed including EMR ? ? ?Lab Tests: ? ?I Ordered, and per

## 2021-10-17 ENCOUNTER — Telehealth: Payer: Self-pay

## 2021-10-17 NOTE — Telephone Encounter (Signed)
Transition Care Management Follow-up Telephone Call ?Date of discharge and from where: 10/15/2021  ?How have you been since you were released from the hospital? Patient stated that she is feeling okay today. Patient stated that she understood all of the results from [facility].  ?Any questions or concerns? No ? ?Items Reviewed: ?Did the pt receive and understand the discharge instructions provided? Yes  ?Medications obtained and verified? Yes  ?Other? No  ?Any new allergies since your discharge? No  ?Dietary orders reviewed? No ?Do you have support at home? Yes  ? ?Functional Questionnaire: (I = Independent and D = Dependent) ?ADLs: I ? ?Bathing/Dressing- I ? ?Meal Prep- I ? ?Eating- I ? ?Maintaining continence- I ? ?Transferring/Ambulation- I ? ?Managing Meds- I ? ? ?Follow up appointments reviewed: ? ?PCP Hospital f/u appt confirmed? No  Patient stated that she trying to establish with a new PCP.  ?Specialist Hospital f/u appt confirmed? No  ?Are transportation arrangements needed? No  ?If their condition worsens, is the pt aware to call PCP or go to the Emergency Dept.? Yes ?Was the patient provided with contact information for the PCP's office or ED? Yes ?Was to pt encouraged to call back with questions or concerns? Yes ? ?

## 2021-10-19 ENCOUNTER — Other Ambulatory Visit: Payer: Self-pay

## 2021-10-19 NOTE — Patient Instructions (Signed)
Visit Information ? ?Ms. Shirley Thompson  - as a part of your Medicaid benefit, you are eligible for care management and care coordination services at no cost or copay. I was unable to reach you by phone today but would be happy to help you with your health related needs. Please feel free to call me @ [phone] ?A member of the Managed Medicaid care management team will reach out to you again over the next 7 days.  ? ?Gus Puma, BSW, MHA ?[facility]  ?High Risk Managed Medicaid Team  ?[phone]  ?

## 2021-10-19 NOTE — Telephone Encounter (Signed)
Tried to call patient back but VM is full.  ?

## 2021-10-19 NOTE — Patient Outreach (Signed)
Care Coordination ? ?10/19/2021 ? ?[name] ?22-Feb-1976 ?[id number] ? ? ?Medicaid Managed Care  ? ?Unsuccessful Outreach Note ? ?10/19/2021 ?Name: Shirley Thompson MRN: [id number] DOB: [birth year] ? ?Referred by: Pcp, No ?Reason for referral : High Risk Managed Medicaid (MM social work unsuccessful telephone outreach) ? ? ?A second unsuccessful telephone outreach was attempted today. The patient was referred to the case management team for assistance with care management and care coordination.  ? ?Follow Up Plan: The care management team will reach out to the patient again over the next 7 days.  ?Gus Puma, BSW, [place] ?[facility]  ?High Risk Managed Medicaid Team  ?[phone]  ?

## 2021-10-28 ENCOUNTER — Telehealth: Payer: Self-pay

## 2021-10-28 NOTE — Patient Instructions (Signed)
Visit Information ? ?Ms. Shirley Thompson  - as a part of your Medicaid benefit, you are eligible for care management and care coordination services at no cost or copay. I was unable to reach you by phone today but would be happy to help you with your health related needs. Please feel free to call me @ [phone] ? ? ? ?Mickel Fuchs, BSW, [place] ?[place]  ?High Risk Managed Medicaid Team  ?[phone]  ?

## 2021-10-28 NOTE — Patient Outreach (Signed)
Care Coordination ? ?10/28/2021 ? ?Shirley Thompson ?05-08-1976 ?[id number] ? ? ?Medicaid Managed Care  ? ?Unsuccessful Outreach Note ? ?10/28/2021 ?Name: Shirley Thompson MRN: [id number] DOB: 06/02/1976 ? ?Referred by: Pcp, No ?Reason for referral : High Risk Managed Medicaid (MM social work unsuccessful telephone outreach) ? ? ?Third unsuccessful telephone outreach was attempted today. The patient was referred to the case management team for assistance with care management and care coordination. The patient's primary care provider has been notified of our unsuccessful attempts to make or maintain contact with the patient. The care management team is pleased to engage with this patient at any time in the future should he/she be interested in assistance from the care management team.  ? ?Follow Up Plan: The patient has been provided with contact information for the care management team and has been advised to call with any health related questions or concerns.  ? ?Mickel Fuchs, BSW, MHA ?[place]  ?High Risk Managed Medicaid Team  ?[phone]  ?

## 2021-11-02 ENCOUNTER — Other Ambulatory Visit: Payer: Self-pay | Admitting: Obstetrics and Gynecology

## 2021-11-02 NOTE — Patient Instructions (Signed)
Visit Information ? ?Ms. Shirley Thompson  - as a part of your Medicaid benefit, you are eligible for care management and care coordination services at no cost or copay. I was unable to reach you by phone today but would be happy to help you with your health related needs. Please feel free to call me at [phone] ? ?Kathi Der RN, BSN ?[place]  [facility] ?Care Management Coordinator - Managed Medicaid High Risk ?[phone] ?  ?

## 2021-11-02 NOTE — Patient Outreach (Signed)
Care Coordination ? ?11/02/2021 ? ?[name] ?[birth year] ?[id number] ? ? ?Medicaid Managed Care  ? ?Unsuccessful Outreach Note ? ?11/02/2021 ?Name: Shirley Thompson MRN: [id number] DOB: Nov 20, 1975 ? ?Referred by: Pcp, No ?Reason for referral : High Risk Managed Medicaid (Unsuccessful telephone outreach) ? ? ?Fourth unsuccessful telephone outreach was attempted today. The patient was referred to the case management team for assistance with care management and care coordination. The patient's primary care provider has been notified of our unsuccessful attempts to make or maintain contact with the patient. The care management team is pleased to engage with this patient at any time in the future should he/she be interested in assistance from the care management team.  ? ?Follow Up Plan: We have been unable to make contact with the patient for follow up. The care management team is available to follow up with the patient after provider conversation with the patient regarding recommendation for care management engagement and subsequent re-referral to the care management team.  ? ?Kathi Der RN, BSN ?[place]  [facility] ?Care Management Coordinator - Managed Medicaid High Risk ?[phone] ?  ? ?

## 2021-11-07 ENCOUNTER — Emergency Department (HOSPITAL_COMMUNITY)
Admission: EM | Admit: 2021-11-07 | Discharge: 2021-11-08 | Discharge status: Against Medical Advice | Payer: Medicaid Other | Attending: Physician Assistant | Admitting: Physician Assistant

## 2021-11-07 ENCOUNTER — Other Ambulatory Visit: Payer: Self-pay

## 2021-11-07 ENCOUNTER — Encounter (HOSPITAL_COMMUNITY): Payer: Self-pay

## 2021-11-07 ENCOUNTER — Emergency Department (HOSPITAL_COMMUNITY): Payer: Medicaid Other

## 2021-11-07 DIAGNOSIS — R1013 Epigastric pain: Secondary | ICD-10-CM | POA: Diagnosis not present

## 2021-11-07 DIAGNOSIS — R0602 Shortness of breath: Secondary | ICD-10-CM | POA: Diagnosis not present

## 2021-11-07 DIAGNOSIS — R1110 Vomiting, unspecified: Secondary | ICD-10-CM | POA: Diagnosis not present

## 2021-11-07 DIAGNOSIS — Z5321 Procedure and treatment not carried out due to patient leaving prior to being seen by health care provider: Secondary | ICD-10-CM | POA: Insufficient documentation

## 2021-11-07 DIAGNOSIS — R079 Chest pain, unspecified: Secondary | ICD-10-CM | POA: Diagnosis not present

## 2021-11-07 DIAGNOSIS — Z9049 Acquired absence of other specified parts of digestive tract: Secondary | ICD-10-CM | POA: Diagnosis not present

## 2021-11-07 LAB — URINALYSIS, ROUTINE W REFLEX MICROSCOPIC
Bilirubin Urine: NEGATIVE
Glucose, UA: NEGATIVE mg/dL
Hgb urine dipstick: NEGATIVE
Ketones, ur: NEGATIVE mg/dL
Leukocytes,Ua: NEGATIVE
Nitrite: NEGATIVE
Protein, ur: NEGATIVE mg/dL
Specific Gravity, Urine: 1.006 (ref 1.005–1.030)
pH: 7 (ref 5.0–8.0)

## 2021-11-07 LAB — COMPREHENSIVE METABOLIC PANEL
ALT: 16 U/L (ref 0–44)
AST: 17 U/L (ref 15–41)
Albumin: 3.6 g/dL (ref 3.5–5.0)
Alkaline Phosphatase: 78 U/L (ref 38–126)
Anion gap: 9 (ref 5–15)
BUN: 9 mg/dL (ref 6–20)
CO2: 23 mmol/L (ref 22–32)
Calcium: 8.9 mg/dL (ref 8.9–10.3)
Chloride: 103 mmol/L (ref 98–111)
Creatinine, Ser: 1.08 mg/dL — ABNORMAL HIGH (ref 0.44–1.00)
GFR, Estimated: >60 mL/min (ref >60)
Glucose, Bld: 98 mg/dL (ref 70–99)
Potassium: 4.1 mmol/L (ref 3.5–5.1)
Sodium: 135 mmol/L (ref 135–145)
Total Bilirubin: 0.6 mg/dL (ref 0.3–1.2)
Total Protein: 6.2 g/dL — ABNORMAL LOW (ref 6.5–8.1)

## 2021-11-07 LAB — CBC
HCT: 42.4 % (ref 36.0–46.0)
Hemoglobin: 13.6 g/dL (ref 12.0–15.0)
MCH: 27.2 pg (ref 26.0–34.0)
MCHC: 32.1 g/dL (ref 30.0–36.0)
MCV: 84.8 fL (ref 80.0–100.0)
Platelets: 201 10*3/uL (ref 150–400)
RBC: 5 MIL/uL (ref 3.87–5.11)
RDW: 14.6 % (ref 11.5–15.5)
WBC: 12 10*3/uL — ABNORMAL HIGH (ref 4.0–10.5)
nRBC: 0 % (ref 0.0–0.2)

## 2021-11-07 LAB — I-STAT BETA HCG BLOOD, ED (MC, WL, AP ONLY): I-stat hCG, quantitative: <5 m[IU]/mL (ref <5)

## 2021-11-07 LAB — TROPONIN I (HIGH SENSITIVITY): Troponin I (High Sensitivity): 4 ng/L (ref <18)

## 2021-11-07 LAB — LIPASE, BLOOD: Lipase: 30 U/L (ref 11–51)

## 2021-11-07 NOTE — ED Provider Triage Note (Signed)
Emergency Medicine Provider Triage Evaluation Note ? ?Shirley Thompson , a 46 y.o. female  was evaluated in triage.  Pt complains of epigastric pain over the last few days.  Has had multiple episodes of emesis.  Does not radiate to back.  Has had her gallbladder removed.  Patient states her pain is so bad she cannot take a deep breath.  No history of AAA, dissection.  Denies overt CP. No lower extremity swelling.  Also states she has been having nasal congestion for quite some time.  She has been seen for this previously.  Patient has been previously diagnosed with hiatal hernia. ? ?Review of Systems  ?Positive: Epigastric pain, emesis, nasal congestion ?Negative: Back pain, lower extremity swelling, chest pain ? ?Physical Exam  ?BP (!) 174/95 (BP Location: Right Arm)   Pulse 74   Temp 98.3 ?F (36.8 ?C) (Oral)   Resp 18   SpO2 97%  ?Gen:   Awake, no distress   ?Resp:  Normal effort  ?ABD:  Diffuse tenderness epigastric region, negative Murphy sign ?MSK:   Moves extremities without difficulty  ?Other:   ? ?Medical Decision Making  ?Medically screening exam initiated at 10:19 PM.  Appropriate orders placed.  MOLLY SAVARINO was informed that the remainder of the evaluation will be completed by another provider, this initial triage assessment does not replace that evaluation, and the importance of remaining in the ED until their evaluation is complete. ? ?Epigastric pain, nasal congestion, shortness of breath. ?  ?Paulmichael Schreck A, PA-C ?11/07/21 2221 ? ?

## 2021-11-07 NOTE — ED Triage Notes (Signed)
Pt complains of epigastric pain over the last few days.  Has had multiple episodes of emesis.  Does not radiate to back.  Has had her gallbladder removed.  Patient states her pain is so bad she cannot take a deep breath. ?

## 2021-11-08 NOTE — ED Notes (Signed)
Patient states she is leaving d/t wait time 

## 2021-11-10 DIAGNOSIS — R079 Chest pain, unspecified: Secondary | ICD-10-CM | POA: Diagnosis not present

## 2021-11-10 DIAGNOSIS — K219 Gastro-esophageal reflux disease without esophagitis: Secondary | ICD-10-CM | POA: Diagnosis not present

## 2021-11-10 DIAGNOSIS — R0789 Other chest pain: Secondary | ICD-10-CM | POA: Diagnosis not present

## 2021-11-11 DIAGNOSIS — R079 Chest pain, unspecified: Secondary | ICD-10-CM | POA: Diagnosis not present

## 2021-11-14 ENCOUNTER — Telehealth: Payer: Self-pay

## 2021-11-14 NOTE — Telephone Encounter (Signed)
Transition Care Management Unsuccessful Follow-up Telephone Call ? ?Date of discharge and from where:  11/11/2021 from [facility] ? ?Attempts:  1st Attempt ? ?Reason for unsuccessful TCM follow-up call:  Left voice message ? ? ? ?

## 2021-11-15 NOTE — Telephone Encounter (Signed)
Transition Care Management Follow-up Telephone Call ?Date of discharge and from where: 11/11/2021 from [facility] ?How have you been since you were released from the hospital? Patient stated that she is feeling okay and did not have any questions or concerns at this time.  ?Any questions or concerns? No ? ?Items Reviewed: ?Did the pt receive and understand the discharge instructions provided? Yes  ?Medications obtained and verified? Yes  ?Other? No  ?Any new allergies since your discharge? No  ?Dietary orders reviewed? No ?Do you have support at home? Yes  ? ?Functional Questionnaire: (I = Independent and D = Dependent) ?ADLs: I ? ?Bathing/Dressing- I ? ?Meal Prep- I ? ?Eating- I ? ?Maintaining continence- I ? ?Transferring/Ambulation- I ? ?Managing Meds- I ? ? ?Follow up appointments reviewed: ? ?PCP Hospital f/u appt confirmed? No   ?Specialist Hospital f/u appt confirmed? No   ?Are transportation arrangements needed? No  ?If their condition worsens, is the pt aware to call PCP or go to the Emergency Dept.? Yes ?Was the patient provided with contact information for the PCP's office or ED? Yes ?Was to pt encouraged to call back with questions or concerns? Yes ? ? ?

## 2022-05-24 ENCOUNTER — Other Ambulatory Visit (HOSPITAL_COMMUNITY): Payer: Self-pay | Admitting: Family Medicine

## 2022-05-24 DIAGNOSIS — Z1231 Encounter for screening mammogram for malignant neoplasm of breast: Secondary | ICD-10-CM

## 2022-05-26 ENCOUNTER — Ambulatory Visit (HOSPITAL_COMMUNITY)
Admission: RE | Admit: 2022-05-26 | Discharge: 2022-05-26 | Disposition: A | Discharge status: Home / Self Care | Payer: Medicaid Other | Source: Ambulatory Visit | Attending: Family Medicine | Admitting: Family Medicine

## 2022-05-26 DIAGNOSIS — Z1231 Encounter for screening mammogram for malignant neoplasm of breast: Secondary | ICD-10-CM

## 2022-05-31 ENCOUNTER — Inpatient Hospital Stay
Admission: RE | Admit: 2022-05-31 | Discharge: 2022-05-31 | Disposition: A | Discharge status: Home / Self Care | Payer: Self-pay | Source: Ambulatory Visit | Attending: Family Medicine | Admitting: Family Medicine

## 2022-05-31 ENCOUNTER — Other Ambulatory Visit (HOSPITAL_COMMUNITY): Payer: Self-pay | Admitting: Family Medicine

## 2022-05-31 ENCOUNTER — Ambulatory Visit
Admission: RE | Admit: 2022-05-31 | Discharge: 2022-05-31 | Disposition: A | Discharge status: Home / Self Care | Payer: Self-pay | Source: Ambulatory Visit | Attending: Family Medicine | Admitting: Family Medicine

## 2022-05-31 DIAGNOSIS — Z1231 Encounter for screening mammogram for malignant neoplasm of breast: Secondary | ICD-10-CM

## 2022-09-06 ENCOUNTER — Encounter: Payer: Self-pay | Admitting: Emergency Medicine

## 2022-09-06 ENCOUNTER — Other Ambulatory Visit: Payer: Self-pay

## 2022-09-06 ENCOUNTER — Ambulatory Visit
Admission: EM | Admit: 2022-09-06 | Discharge: 2022-09-06 | Disposition: A | Discharge status: Home / Self Care | Payer: Medicaid Other

## 2022-09-06 DIAGNOSIS — M79605 Pain in left leg: Secondary | ICD-10-CM

## 2022-09-06 MED ORDER — METHYLPREDNISOLONE SODIUM SUCC 125 MG IJ SOLR: 80.0000 mg | Freq: Once | INTRAMUSCULAR | Status: DC

## 2022-09-06 MED ORDER — PREDNISONE 20 MG PO TABS: 40.0000 mg | ORAL_TABLET | Freq: Every day | ORAL | 0 refills | Status: AC

## 2022-09-06 NOTE — ED Provider Notes (Addendum)
[facility]    CSN: [id number] Arrival date & time: 09/06/22  1708      History   Chief Complaint Chief Complaint  Patient presents with   Leg Pain    HPI Shirley Thompson is a 47 y.o. female.   The history is provided by the patient.   Patient presents with complaints of left leg pain. She describes discomfort in the anterior aspect of the left leg that radiates down to her lower leg and foot.  She states symptoms started today when she was sitting on her desk.  States pain started in the arch of the left foot and radiates up into the left knee.  She states that she has had the same or similar symptoms in the right leg, but has never had this in the left leg.  Denies any new injuries, traumas, or falls.  Pain worsens with ambulation.  She denies numbness, tingling, or weakness.  Patient reports that she took ibuprofen for her symptoms with minimal relief.  Patient states at her job, she does not sit for prolonged periods of time.  Review of the patient's chart reveals that she was seen for this symptom in the emergency department in February 2023.  Previous chart indicates patient complained of the same or similar symptoms, the chart reports that patient had a previous MRI of her back that showed bulging disks.  She also was referred to a neurosurgeon who provided steroid injections.  Patient informed this provider that she has not been seen or treated for the symptoms in the past. There are no alleviating factors.  Past Medical History:  Diagnosis Date   Anxiety    Chronic back pain    Depression    GERD (gastroesophageal reflux disease)    Headache    Hypercholesteremia    Hypertension     There are no problems to display for this patient.   Past Surgical History:  Procedure Laterality Date   CHOLECYSTECTOMY     TUBAL LIGATION      OB History   No obstetric history on file.      Home Medications    Prior to Admission medications   Medication Sig Start Date  End Date Taking? Authorizing Provider  pantoprazole (PROTONIX) 40 MG tablet Take 40 mg by mouth 2 (two) times daily.   Yes [provider]  predniSONE (DELTASONE) 20 MG tablet Take 2 tablets (40 mg total) by mouth daily with breakfast for 5 days. 09/06/22 09/11/22 Yes Louann Hopson-Warren, Alda Lea, NP  rosuvastatin (CRESTOR) 5 MG tablet Take 5 mg by mouth daily.   Yes [provider]  Aspirin-Salicylamide-Caffeine 151-76-16 MG TABS Take 1 packet by mouth daily as needed. Once taken as needed for pain    [provider]  cholecalciferol (VITAMIN D3) 25 MCG (1000 UNIT) tablet Take 1,000 Units by mouth daily.    [provider]  cyclobenzaprine (FLEXERIL) 10 MG tablet Take 1 tablet (10 mg total) by mouth 3 (three) times daily as needed for muscle spasms. 09/16/21   Veryl Speak, MD  dicyclomine (BENTYL) 20 MG tablet Take 1 tablet (20 mg total) by mouth 2 (two) times daily. Patient not taking: Reported on 09/07/2021 10/04/15   Horton, Barbette Hair, MD  hydrochlorothiazide (HYDRODIURIL) 25 MG tablet Take 25 mg by mouth every morning.    [provider]  ibuprofen (ADVIL) 600 MG tablet Take 1 tablet (600 mg total) by mouth every 6 (six) hours as needed. 10/02/21   Pearline Cables,  Samuel A, DO  ibuprofen (ADVIL) 800 MG tablet Take 1 tablet (800 mg total) by mouth every 8 (eight) hours as needed. 08/02/21   Hayden Rasmussen, MD  lidocaine (LIDODERM) 5 % Place 1 patch onto the skin daily as needed. Remove & Discard patch within 12 hours or as directed by MD 10/02/21   Wynona Dove A, DO  losartan (COZAAR) 25 MG tablet Take 25 mg by mouth daily.    [provider]  meclizine (ANTIVERT) 25 MG tablet Take 1 tablet (25 mg total) by mouth 3 (three) times daily as needed for dizziness. Patient not taking: Reported on 09/07/2021 07/23/14   Francine Graven, DO  metoCLOPramide (REGLAN) 10 MG tablet Take 1 tablet (10 mg total) by mouth every 8 (eight) hours as needed for nausea. Patient taking  differently: Take 10 mg by mouth every 8 (eight) hours as needed for nausea. taking 04/11/16 04/11/17  Daymon Larsen, MD  ondansetron (ZOFRAN ODT) 4 MG disintegrating tablet Take 1 tablet (4 mg total) by mouth every 8 (eight) hours as needed for nausea or vomiting. Patient not taking: Reported on 09/07/2021 07/23/14   Francine Graven, DO  oxyCODONE-acetaminophen (PERCOCET/ROXICET) 5-325 MG tablet Take 1 tablet by mouth every 6 (six) hours as needed for severe pain. 08/02/21   Hayden Rasmussen, MD  traMADol (ULTRAM) 50 MG tablet Take 1 tablet (50 mg total) by mouth every 6 (six) hours as needed. Patient not taking: Reported on 09/07/2021 04/11/16   Daymon Larsen, MD    Family History History reviewed. No pertinent family history.  Social History Social History   Tobacco Use   Smoking status: Every Day    Packs/day: 0.50    Types: Cigarettes   Smokeless tobacco: Never  Substance Use Topics   Alcohol use: No   Drug use: No     Allergies   Amlodipine, Azithromycin, Penicillins, Sulfa antibiotics, Amoxicillin, and Lisinopril   Review of Systems Review of Systems Per HPI  Physical Exam Triage Vital Signs ED Triage Vitals  Enc Vitals Group     BP 09/06/22 1818 133/78     Pulse Rate 09/06/22 1818 84     Resp 09/06/22 1818 20     Temp 09/06/22 1818 99 F (37.2 C)     Temp Source 09/06/22 1818 Oral     SpO2 09/06/22 1818 98 %     Weight --      Height --      Head Circumference --      Peak Flow --      Pain Score 09/06/22 1813 4     Pain Loc --      Pain Edu? --      Excl. in [place]? --    No data found.  Updated Vital Signs BP 133/78 (BP Location: Right Arm)   Pulse 84   Temp 99 F (37.2 C) (Oral)   Resp 20   LMP 08/28/2022 (Approximate)   SpO2 98%   Visual Acuity Right Eye Distance:   Left Eye Distance:   Bilateral Distance:    Right Eye Near:   Left Eye Near:    Bilateral Near:     Physical Exam Vitals and nursing note reviewed.  Constitutional:       Appearance: Normal appearance.  Eyes:     Extraocular Movements: Extraocular movements intact.     Pupils: Pupils are equal, round, and reactive to light.  Pulmonary:     Effort: Pulmonary effort is normal.  Musculoskeletal:     Cervical back: Normal range of motion.     Left lower leg: Tenderness (Tenderness noted to the anterior and posterior aspect of the left lower leg.) present. No swelling or deformity. No edema.     Left ankle: No swelling. Tenderness present over the lateral malleolus and medial malleolus. Decreased range of motion.     Left foot: Decreased range of motion. Tenderness (Tenderness noted to the arch of the left foot, with tenderness starting in the midfoot) present. No swelling or deformity. Normal pulse.     Comments: Strength is 5 out of 5, but walks with an antalgic gait.  There is no weakness and strength is 5 out of 5 in both lower extremities.  Sensation is equal in both lower extremities.   Lymphadenopathy:     Cervical: No cervical adenopathy.  Skin:    General: Skin is warm and dry.  Neurological:     General: No focal deficit present.     Mental Status: She is alert and oriented to person, place, and time.  Psychiatric:        Mood and Affect: Mood normal.        Behavior: Behavior normal.      UC Treatments / Results  Labs (all labs ordered are listed, but only abnormal results are displayed) Labs Reviewed - No data to display  EKG   Radiology No results found.  Procedures Procedures (including critical care time)  Medications Ordered in UC Medications  methylPREDNISolone sodium succinate (SOLU-MEDROL) 125 mg/2 mL injection 80 mg (80 mg Intramuscular Patient Refused/Not Given 09/06/22 1910)    Initial Impression / Assessment and Plan / UC Course  I have reviewed the triage vital signs and the nursing notes.  Pertinent labs & imaging results that were available during my care of the patient were reviewed by me and considered in my medical  decision making (see chart for details).  The patient is well-appearing, she is in no acute distress, vital signs are stable.  Symptoms appear to be of radicular etiology, suspect inflammation may be present.  Will start patient on prednisone 40 mg for the next 5 days.  No red flag symptoms are noted on the patient exam, there is no concern for any emergent situation at this time.  Supportive care recommendations were provided to the patient to include use of ice, stretching, and staying active.  Patient advised to take over-the-counter analgesics for pain or discomfort.  Patient was advised to follow-up with the neurosurgeon that she saw before if symptoms fail to improve.  Patient verbalizes understanding.  All questions were answered.  Patient stable for discharge.   Final Clinical Impressions(s) / UC Diagnoses   Final diagnoses:  Left leg pain     Discharge Instructions      Take medication as prescribed. May take over-the-counter Tylenol arthritis strength 650 mg tablet as directed for pain or discomfort.  Take this while you are taking the prednisone.  Once you have completed the prednisone, recommend beginning ibuprofen as needed. Increase fluids and allow for plenty of rest. As discussed, recommend using a frozen water bottle to roll under the left foot to help with pain or discomfort. If symptoms do not improve, recommend that you follow-up with your primary care physician or the neurosurgeon that you have seen in the past for further evaluation. Go to the emergency department immediately if you have experience any leg weakness and cannot walk, severe numbness and tingling, or other concerns.  Follow-up as needed.     ED Prescriptions     Medication Sig Dispense Auth. Provider   predniSONE (DELTASONE) 20 MG tablet Take 2 tablets (40 mg total) by mouth daily with breakfast for 5 days. 10 tablet Aleshka Corney-Warren, Alda Lea, NP      PDMP not reviewed this encounter.    Tish Men, NP 09/06/22 1904    Tish Men, NP 09/06/22 1958

## 2022-09-06 NOTE — ED Triage Notes (Addendum)
Pt reports left foot pain that radiates to left knee since this afternoon. Pt denies and known injury and reports was sitting at work when pain first started. Pt reports took 800mg  ibuprofen with no change in pain.

## 2022-09-06 NOTE — Discharge Instructions (Signed)
Take medication as prescribed. May take over-the-counter Tylenol arthritis strength 650 mg tablet as directed for pain or discomfort.  Take this while you are taking the prednisone.  Once you have completed the prednisone, recommend beginning ibuprofen as needed. Increase fluids and allow for plenty of rest. As discussed, recommend using a frozen water bottle to roll under the left foot to help with pain or discomfort. If symptoms do not improve, recommend that you follow-up with your primary care physician or the neurosurgeon that you have seen in the past for further evaluation. Go to the emergency department immediately if you have experience any leg weakness and cannot walk, severe numbness and tingling, or other concerns. Follow-up as needed.

## 2022-11-23 ENCOUNTER — Encounter (HOSPITAL_COMMUNITY): Payer: Self-pay

## 2022-11-23 ENCOUNTER — Other Ambulatory Visit: Payer: Self-pay

## 2022-11-23 ENCOUNTER — Emergency Department (HOSPITAL_COMMUNITY)
Admission: EM | Admit: 2022-11-23 | Discharge: 2022-11-24 | Disposition: A | Discharge status: Home / Self Care | Payer: Medicaid Other | Attending: Emergency Medicine | Admitting: Emergency Medicine

## 2022-11-23 DIAGNOSIS — R059 Cough, unspecified: Secondary | ICD-10-CM | POA: Diagnosis present

## 2022-11-23 DIAGNOSIS — I10 Essential (primary) hypertension: Secondary | ICD-10-CM | POA: Insufficient documentation

## 2022-11-23 DIAGNOSIS — J209 Acute bronchitis, unspecified: Secondary | ICD-10-CM | POA: Insufficient documentation

## 2022-11-23 DIAGNOSIS — Z7982 Long term (current) use of aspirin: Secondary | ICD-10-CM | POA: Diagnosis not present

## 2022-11-23 DIAGNOSIS — Z87891 Personal history of nicotine dependence: Secondary | ICD-10-CM | POA: Insufficient documentation

## 2022-11-23 DIAGNOSIS — Z79899 Other long term (current) drug therapy: Secondary | ICD-10-CM | POA: Insufficient documentation

## 2022-11-23 NOTE — ED Triage Notes (Signed)
Pt reports on Saturday while she was outside she began having bd sinus congestion and coughing that she believes is from pollen.  Pt reports when she lays down she coughs a lot form her post nasal drip.

## 2022-11-24 MED ORDER — PREDNISONE 50 MG PO TABS: 50.0000 mg | ORAL_TABLET | Freq: Every day | ORAL | 0 refills | Status: DC

## 2022-11-24 MED ORDER — PREDNISONE 50 MG PO TABS
60.0000 mg | ORAL_TABLET | Freq: Once | ORAL | Status: AC
  Administered 2022-11-24: 60 mg via ORAL
  Filled 2022-11-24: qty 1

## 2022-11-24 MED ORDER — ALBUTEROL SULFATE HFA 108 (90 BASE) MCG/ACT IN AERS
2.0000 | INHALATION_SPRAY | RESPIRATORY_TRACT | Status: DC | PRN
  Administered 2022-11-24: 2 via RESPIRATORY_TRACT
  Filled 2022-11-24: qty 6.7

## 2022-11-24 MED ORDER — IPRATROPIUM-ALBUTEROL 0.5-2.5 (3) MG/3ML IN SOLN
3.0000 mL | Freq: Once | RESPIRATORY_TRACT | Status: AC
  Administered 2022-11-24: 3 mL via RESPIRATORY_TRACT
  Filled 2022-11-24: qty 3

## 2022-11-24 NOTE — ED Provider Notes (Signed)
[place] EMERGENCY DEPARTMENT AT [facility] Provider Note   CSN: [id number] Arrival date & time: 11/23/22  2218     History  Chief Complaint  Patient presents with   Sinus Problem    Shirley Thompson is a 47 y.o. female.  The history is provided by the patient.  Sinus Problem  She has history of hypertension, hyperlipidemia, GERD and comes in complaining of cough for the last 5 days.  Cough is occasionally productive of a small amount of clear sputum.  She denies rhinorrhea or nasal congestion.  She does feel at times that she is choking on her sputum even though she cannot raise any.  She denies fever, chills, sweats.  She denies any sick contacts.  Cough tends to be worse at night.  Of note, she is a cigarette smoker but states that she has not been able to smoke since she started having the coughing.   Home Medications Prior to Admission medications   Medication Sig Start Date End Date Taking? Authorizing Provider  Aspirin-Salicylamide-Caffeine 325-95-16 MG TABS Take 1 packet by mouth daily as needed. Once taken as needed for pain    [provider]  cholecalciferol (VITAMIN D3) 25 MCG (1000 UNIT) tablet Take 1,000 Units by mouth daily.    [provider]  cyclobenzaprine (FLEXERIL) 10 MG tablet Take 1 tablet (10 mg total) by mouth 3 (three) times daily as needed for muscle spasms. 09/16/21   Geoffery Lyons, MD  dicyclomine (BENTYL) 20 MG tablet Take 1 tablet (20 mg total) by mouth 2 (two) times daily. Patient not taking: Reported on 09/07/2021 10/04/15   Horton, Mayer Masker, MD  hydrochlorothiazide (HYDRODIURIL) 25 MG tablet Take 25 mg by mouth every morning.    [provider]  ibuprofen (ADVIL) 600 MG tablet Take 1 tablet (600 mg total) by mouth every 6 (six) hours as needed. 10/02/21   Sloan Leiter, DO  ibuprofen (ADVIL) 800 MG tablet Take 1 tablet (800 mg total) by mouth every 8 (eight) hours as needed. 08/02/21   Terrilee Files, MD  lidocaine  (LIDODERM) 5 % Place 1 patch onto the skin daily as needed. Remove & Discard patch within 12 hours or as directed by MD 10/02/21   Tanda Rockers A, DO  losartan (COZAAR) 25 MG tablet Take 25 mg by mouth daily.    [provider]  meclizine (ANTIVERT) 25 MG tablet Take 1 tablet (25 mg total) by mouth 3 (three) times daily as needed for dizziness. Patient not taking: Reported on 09/07/2021 07/23/14   Samuel Jester, DO  metoCLOPramide (REGLAN) 10 MG tablet Take 1 tablet (10 mg total) by mouth every 8 (eight) hours as needed for nausea. Patient taking differently: Take 10 mg by mouth every 8 (eight) hours as needed for nausea. taking 04/11/16 04/11/17  Jennye Moccasin, MD  ondansetron (ZOFRAN ODT) 4 MG disintegrating tablet Take 1 tablet (4 mg total) by mouth every 8 (eight) hours as needed for nausea or vomiting. Patient not taking: Reported on 09/07/2021 07/23/14   Samuel Jester, DO  oxyCODONE-acetaminophen (PERCOCET/ROXICET) 5-325 MG tablet Take 1 tablet by mouth every 6 (six) hours as needed for severe pain. 08/02/21   Terrilee Files, MD  pantoprazole (PROTONIX) 40 MG tablet Take 40 mg by mouth 2 (two) times daily.    [provider]  rosuvastatin (CRESTOR) 5 MG tablet Take 5 mg by mouth daily.    [provider]  traMADol (ULTRAM) 50 MG tablet  Take 1 tablet (50 mg total) by mouth every 6 (six) hours as needed. Patient not taking: Reported on 09/07/2021 04/11/16   Jennye Moccasin, MD      Allergies    Amlodipine, Azithromycin, Penicillins, Sulfa antibiotics, Amoxicillin, and Lisinopril    Review of Systems   Review of Systems  All other systems reviewed and are negative.   Physical Exam Updated Vital Signs BP (!) 126/92 (BP Location: Right Arm)   Pulse 75   Temp 98.9 F (37.2 C) (Oral)   Resp 16   Ht 5\' 4"  (1.626 m)   Wt 84.4 kg   LMP 11/09/2022 (Approximate)   SpO2 100%   BMI 31.93 kg/m  Physical Exam Vitals and nursing note reviewed.   47 year old  female, resting comfortably and in no acute distress. Vital signs are significant for borderline elevated diastolic blood pressure. Oxygen saturation is 100%, which is normal. Head is normocephalic and atraumatic. PERRLA, EOMI. Oropharynx is clear.  There is no sinus tenderness. Neck is nontender and supple without adenopathy or JVD. Back is nontender and there is no CVA tenderness. Lungs are clear without rales, wheezes, or rhonchi.  Slightly prolonged exhalation phase is noted. Chest is nontender. Heart has regular rate and rhythm without murmur. Abdomen is soft, flat, nontender. Extremities have no cyanosis or edema, full range of motion is present. Skin is warm and dry without rash. Neurologic: Mental status is normal, cranial nerves are intact, moves all extremities equally.  ED Results / Procedures / Treatments    Procedures Procedures    Medications Ordered in ED Medications  albuterol (VENTOLIN HFA) 108 (90 Base) MCG/ACT inhaler 2 puff (has no administration in time range)  predniSONE (DELTASONE) tablet 60 mg (has no administration in time range)  ipratropium-albuterol (DUONEB) 0.5-2.5 (3) MG/3ML nebulizer solution 3 mL (3 mLs Nebulization Given 11/24/22 0158)    ED Course/ Medical Decision Making/ A&P                             Medical Decision Making Risk Prescription drug management.   Cough which appears to be part of a viral illness, doubt pneumonia.  I have ordered a nebulizer treatment with albuterol and ipratropium.  She noted significant improvement in cough with nebulizer treatment.  I have ordered a dose of prednisone.  I have ordered an albuterol inhaler for the patient to take home.  I have also ordered a prescription for a 5-day course of prednisone.  I have counseled the patient on the need to stop smoking and ways to achieve that goal.  Final Clinical Impression(s) / ED Diagnoses Final diagnoses:  Acute bronchitis, unspecified organism    Rx / DC  Orders ED Discharge Orders          Ordered    predniSONE (DELTASONE) 50 MG tablet  Daily        11/24/22 0252              Dione Booze, MD 11/24/22 [phone]

## 2022-11-24 NOTE — Discharge Instructions (Signed)
You may use the inhaler, 2 puffs at a time, every 4 hours as needed to help control your coughing.  It would help you greatly if you were to stop smoking.

## 2023-01-25 IMAGING — DX DG RIBS W/ CHEST 3+V*R*
5 series · 5 of 5 positions shown · non-contrast
Comparison: None.

CLINICAL DATA: Acute right rib pain

EXAM:
RIGHT RIBS AND CHEST - 3+ VIEW

[chest pa]
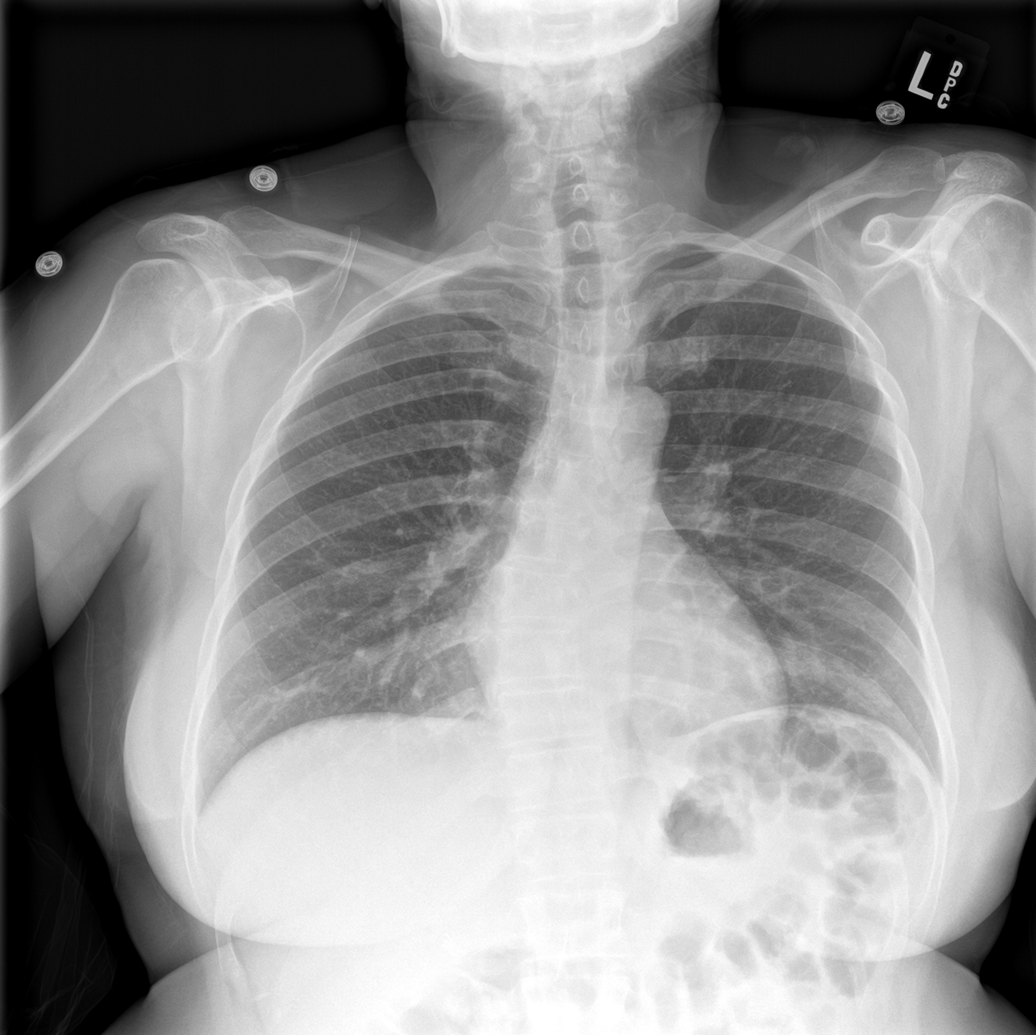

[rib pa (1 of 2)]
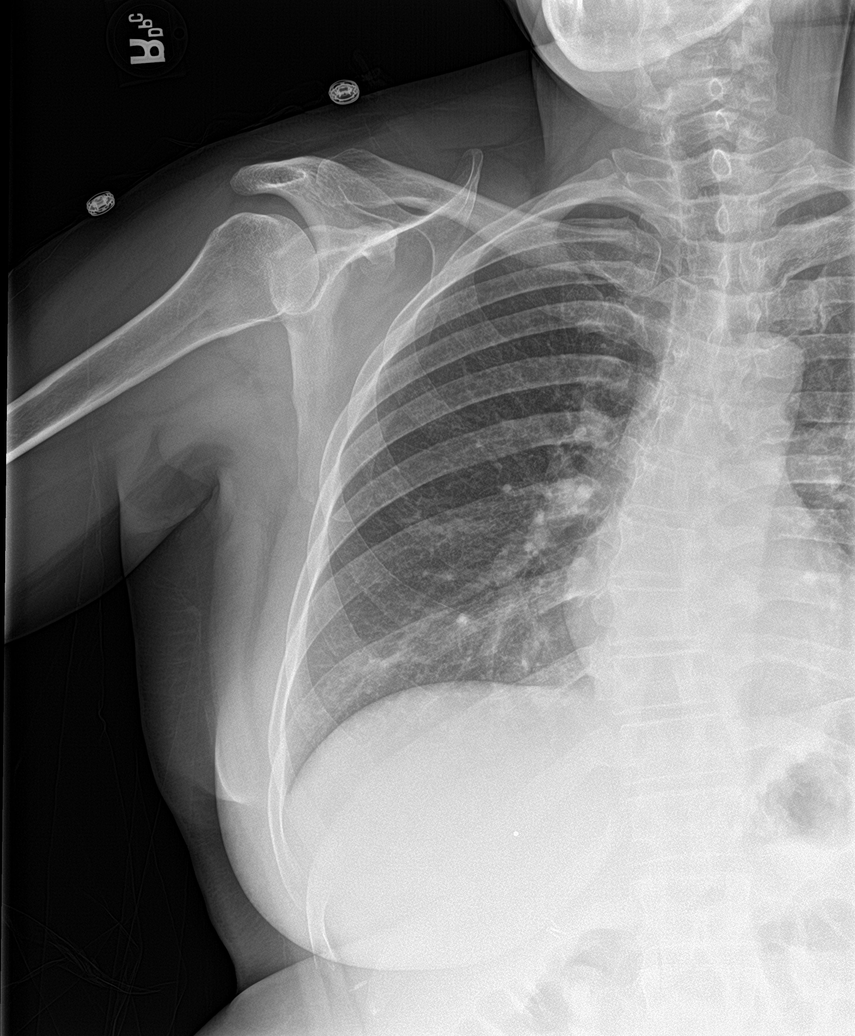

[rib pa obl (1 of 2)]
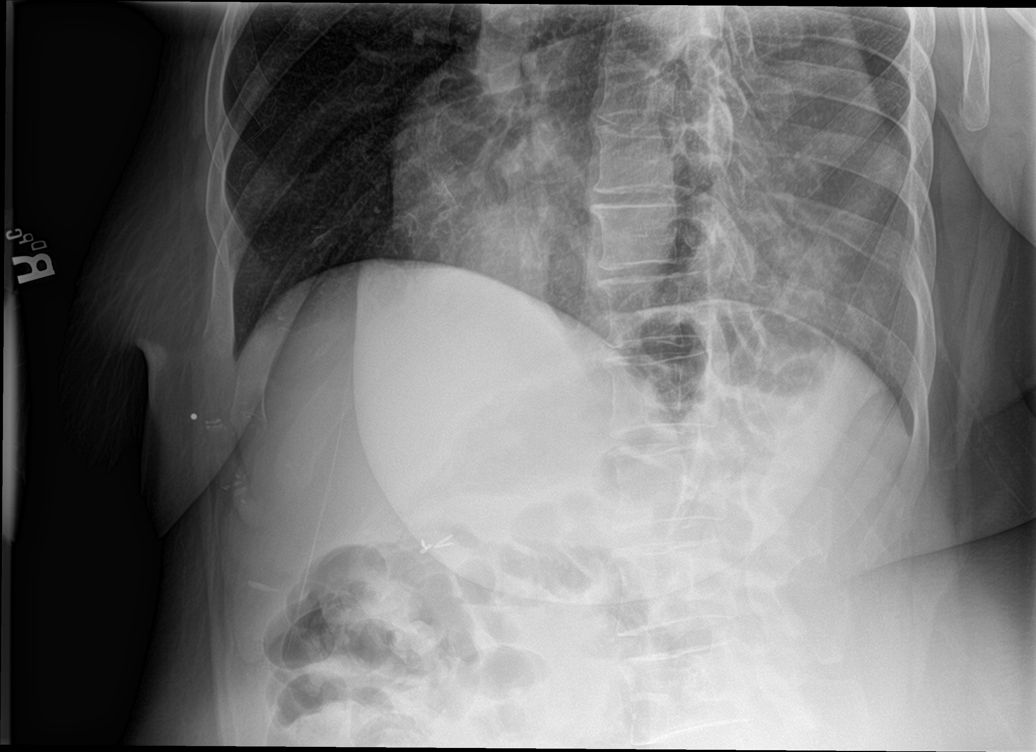

[rib pa obl (2 of 2)]
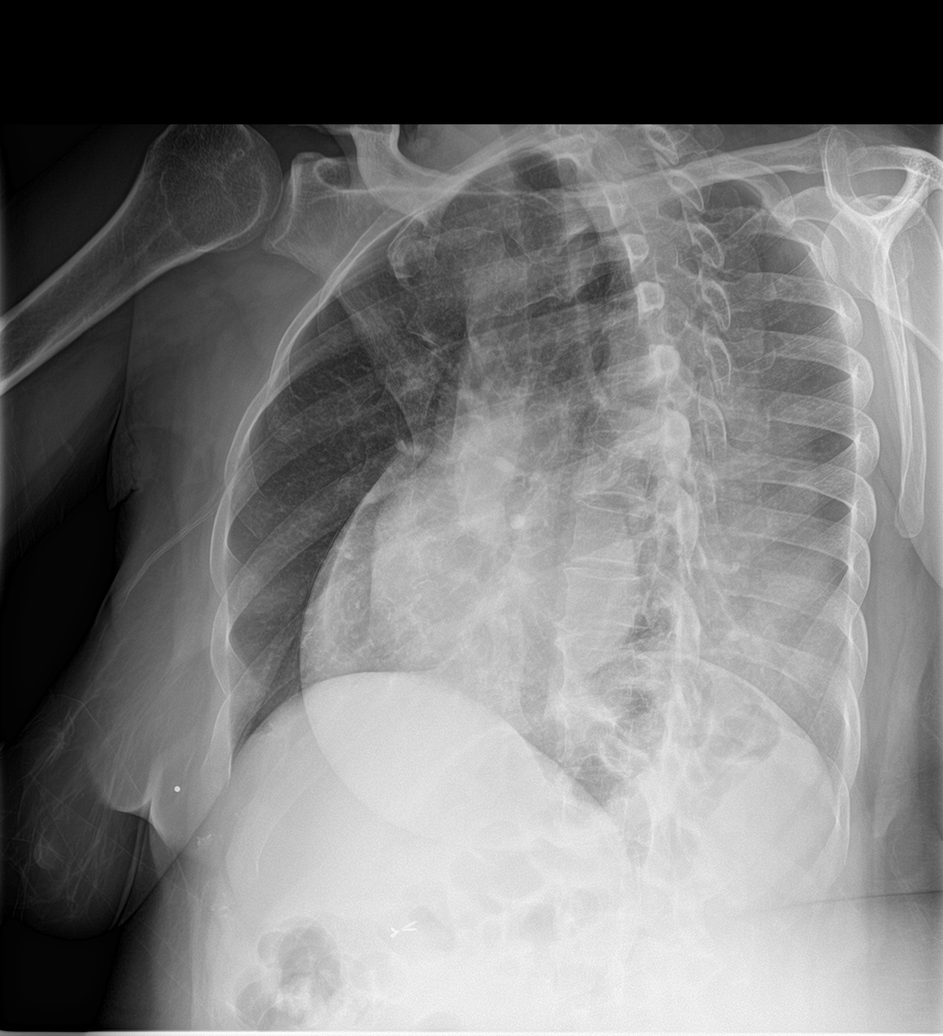

[rib pa (2 of 2)]
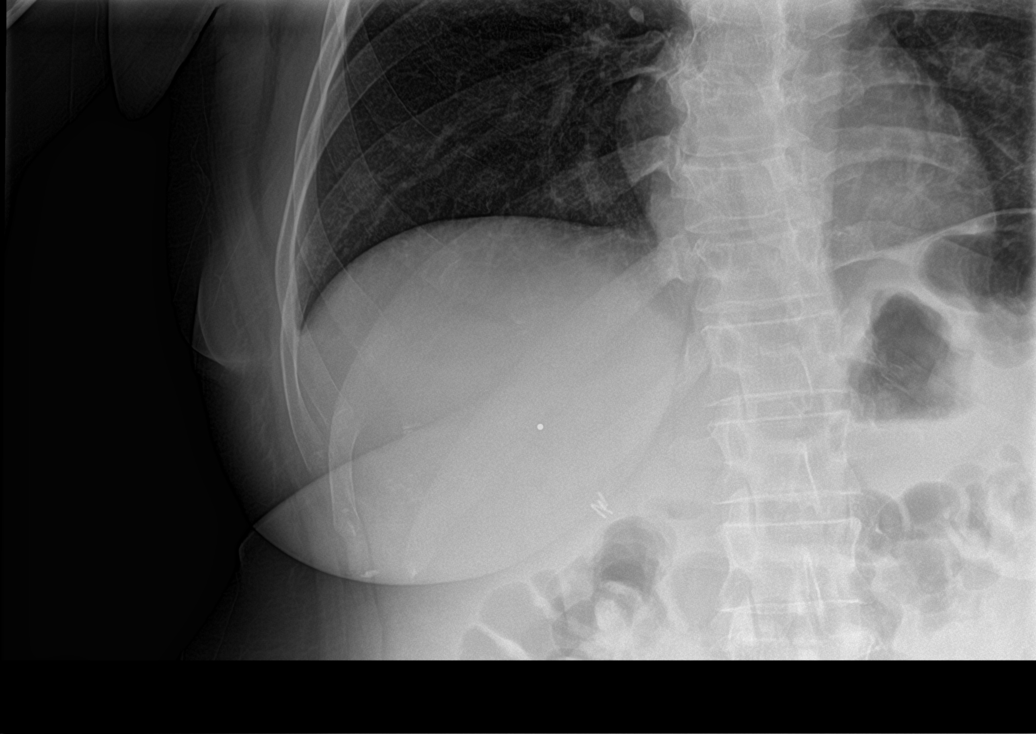

[5 of 5 positions shown; findings below may reference images not displayed]

FINDINGS: No fracture or other bone lesions are seen involving the ribs. There
is no evidence of pneumothorax or pleural effusion. Both lungs are
clear. Heart size and mediastinal contours are within normal limits.
IMPRESSION: Negative.

## 2023-04-27 ENCOUNTER — Ambulatory Visit
Admission: EM | Admit: 2023-04-27 | Discharge: 2023-04-27 | Disposition: A | Discharge status: Home / Self Care | Payer: Medicaid Other | Attending: Family Medicine | Admitting: Family Medicine

## 2023-04-27 DIAGNOSIS — M778 Other enthesopathies, not elsewhere classified: Secondary | ICD-10-CM | POA: Diagnosis not present

## 2023-04-27 MED ORDER — TIZANIDINE HCL 4 MG PO CAPS: 4.0000 mg | ORAL_CAPSULE | Freq: Three times a day (TID) | ORAL | 0 refills | Status: AC | PRN

## 2023-04-27 MED ORDER — PREDNISONE 20 MG PO TABS: 40.0000 mg | ORAL_TABLET | Freq: Every day | ORAL | 0 refills | Status: DC

## 2023-04-27 NOTE — ED Triage Notes (Signed)
Pt reports her left are has been painful x 3 weeks. Has a hard time picking things up and when she does the pain shoots up her arm.

## 2023-04-27 NOTE — ED Provider Notes (Signed)
[facility]    CSN: [id number] Arrival date & time: 04/27/23  1616      History   Chief Complaint No chief complaint on file.   HPI Shirley Thompson is a 47 y.o. female.   Presenting today with 3-week history of left elbow pain radiating at times up the arm and down the arm.  States pain darted shortly after trying to move a dresser with her daughter.  Some slight swelling to the area but no discoloration, new numbness or tingling, loss of range of motion.  Trying ibuprofen with no relief.    Past Medical History:  Diagnosis Date   Anxiety    Chronic back pain    Depression    GERD (gastroesophageal reflux disease)    Headache    Hypercholesteremia    Hypertension     There are no problems to display for this patient.   Past Surgical History:  Procedure Laterality Date   CHOLECYSTECTOMY     TUBAL LIGATION      OB History   No obstetric history on file.      Home Medications    Prior to Admission medications   Medication Sig Start Date End Date Taking? Authorizing Provider  predniSONE (DELTASONE) 20 MG tablet Take 2 tablets (40 mg total) by mouth daily with breakfast. 04/27/23  Yes Particia Nearing, PA-C  tiZANidine (ZANAFLEX) 4 MG capsule Take 1 capsule (4 mg total) by mouth 3 (three) times daily as needed for muscle spasms. Do not drink alcohol or drive while taking this medication.  May cause drowsiness. 04/27/23  Yes Particia Nearing, PA-C  Aspirin-Salicylamide-Caffeine [phone] MG TABS Take 1 packet by mouth daily as needed. Once taken as needed for pain    [provider]  cholecalciferol (VITAMIN D3) 25 MCG (1000 UNIT) tablet Take 1,000 Units by mouth daily.    [provider]  cyclobenzaprine (FLEXERIL) 10 MG tablet Take 1 tablet (10 mg total) by mouth 3 (three) times daily as needed for muscle spasms. 09/16/21   Geoffery Lyons, MD  hydrochlorothiazide (HYDRODIURIL) 25 MG tablet Take 25 mg by mouth every morning.     [provider]  ibuprofen (ADVIL) 600 MG tablet Take 1 tablet (600 mg total) by mouth every 6 (six) hours as needed. 10/02/21   Sloan Leiter, DO  ibuprofen (ADVIL) 800 MG tablet Take 1 tablet (800 mg total) by mouth every 8 (eight) hours as needed. 08/02/21   Terrilee Files, MD  lidocaine (LIDODERM) 5 % Place 1 patch onto the skin daily as needed. Remove & Discard patch within 12 hours or as directed by MD 10/02/21   Tanda Rockers A, DO  losartan (COZAAR) 25 MG tablet Take 25 mg by mouth daily.    [provider]  metoCLOPramide (REGLAN) 10 MG tablet Take 1 tablet (10 mg total) by mouth every 8 (eight) hours as needed for nausea. Patient taking differently: Take 10 mg by mouth every 8 (eight) hours as needed for nausea. taking 04/11/16 04/11/17  Jennye Moccasin, MD  ondansetron (ZOFRAN ODT) 4 MG disintegrating tablet Take 1 tablet (4 mg total) by mouth every 8 (eight) hours as needed for nausea or vomiting. Patient not taking: Reported on 09/07/2021 07/23/14   Samuel Jester, DO  oxyCODONE-acetaminophen (PERCOCET/ROXICET) 5-325 MG tablet Take 1 tablet by mouth every 6 (six) hours as needed for severe pain. 08/02/21   Terrilee Files, MD  pantoprazole (PROTONIX) 40 MG tablet Take 40 mg  by mouth 2 (two) times daily.    [provider]  predniSONE (DELTASONE) 50 MG tablet Take 1 tablet (50 mg total) by mouth daily. 11/24/22   Dione Booze, MD  rosuvastatin (CRESTOR) 5 MG tablet Take 5 mg by mouth daily.    [provider]    Family History History reviewed. No pertinent family history.  Social History Social History   Tobacco Use   Smoking status: Former    Current packs/day: 0.50    Types: Cigarettes   Smokeless tobacco: Never  Vaping Use   Vaping status: Some Days  Substance Use Topics   Alcohol use: No   Drug use: No     Allergies   Amlodipine, Azithromycin, Penicillins, Sulfa antibiotics, Amoxicillin, and Lisinopril   Review of Systems Review  of Systems Per HPI  Physical Exam Triage Vital Signs ED Triage Vitals  Encounter Vitals Group     BP 04/27/23 1653 (!) 143/90     Systolic BP Percentile --      Diastolic BP Percentile --      Pulse Rate 04/27/23 1653 78     Resp 04/27/23 1653 18     Temp 04/27/23 1653 97.9 F (36.6 C)     Temp Source 04/27/23 1653 Oral     SpO2 04/27/23 1653 98 %     Weight --      Height --      Head Circumference --      Peak Flow --      Pain Score 04/27/23 1651 6     Pain Loc --      Pain Education --      Exclude from Growth Chart --    No data found.  Updated Vital Signs BP (!) 143/90 (BP Location: Right Arm)   Pulse 78   Temp 97.9 F (36.6 C) (Oral)   Resp 18   LMP 04/25/2023 (Exact Date) Comment: starty date  SpO2 98%   Visual Acuity Right Eye Distance:   Left Eye Distance:   Bilateral Distance:    Right Eye Near:   Left Eye Near:    Bilateral Near:     Physical Exam Vitals and nursing note reviewed.  Constitutional:      Appearance: Normal appearance. She is not ill-appearing.  HENT:     Head: Atraumatic.  Eyes:     Extraocular Movements: Extraocular movements intact.     Conjunctiva/sclera: Conjunctivae normal.  Cardiovascular:     Rate and Rhythm: Normal rate and regular rhythm.     Heart sounds: Normal heart sounds.  Pulmonary:     Effort: Pulmonary effort is normal.     Breath sounds: Normal breath sounds.  Musculoskeletal:        General: Tenderness present. No signs of injury. Normal range of motion.     Cervical back: Normal range of motion and neck supple.     Comments: Range of motion of the left upper extremity intact, though painful at the elbow.  Tender to palpation to the lateral elbow and proximal forearm  Skin:    General: Skin is warm and dry.     Findings: No bruising or erythema.  Neurological:     Mental Status: She is alert and oriented to person, place, and time.     Comments: Left upper extremity neurovascularly intact  Psychiatric:         Mood and Affect: Mood normal.        Thought Content: Thought content normal.  Judgment: Judgment normal.      UC Treatments / Results  Labs (all labs ordered are listed, but only abnormal results are displayed) Labs Reviewed - No data to display  EKG   Radiology No results found.  Procedures Procedures (including critical care time)  Medications Ordered in UC Medications - No data to display  Initial Impression / Assessment and Plan / UC Course  I have reviewed the triage vital signs and the nursing notes.  Pertinent labs & imaging results that were available during my care of the patient were reviewed by me and considered in my medical decision making (see chart for details).     Suspect tendinitis, treat with prednisone, Zanaflex, heat, massage, stretches.  Compression wrap placed. Low up with orthopedics if not resolving. Final Clinical Impressions(s) / UC Diagnoses   Final diagnoses:  Left elbow tendinitis     Discharge Instructions      I have sent over a steroid for pain and inflammation and a muscle relaxer.  You may do heat, massage, stretches, gentle range of motion exercises with the elbow and avoid strenuous activity.  I recommend a compression sleeve additionally or Ace wrap.   ED Prescriptions     Medication Sig Dispense Auth. Provider   predniSONE (DELTASONE) 20 MG tablet Take 2 tablets (40 mg total) by mouth daily with breakfast. 10 tablet Particia Nearing, PA-C   tiZANidine (ZANAFLEX) 4 MG capsule Take 1 capsule (4 mg total) by mouth 3 (three) times daily as needed for muscle spasms. Do not drink alcohol or drive while taking this medication.  May cause drowsiness. 15 capsule Particia Nearing, [place]      PDMP not reviewed this encounter.   Particia Nearing, [place] 04/27/23 1751

## 2023-04-27 NOTE — Discharge Instructions (Addendum)
I have sent over a steroid for pain and inflammation and a muscle relaxer.  You may do heat, massage, stretches, gentle range of motion exercises with the elbow and avoid strenuous activity.  I recommend a compression sleeve additionally or Ace wrap.

## 2023-07-10 ENCOUNTER — Encounter (HOSPITAL_COMMUNITY): Payer: Self-pay

## 2023-07-10 ENCOUNTER — Other Ambulatory Visit: Payer: Self-pay

## 2023-07-10 ENCOUNTER — Emergency Department (HOSPITAL_COMMUNITY)
Admission: EM | Admit: 2023-07-10 | Discharge: 2023-07-10 | Disposition: A | Discharge status: Home / Self Care | Payer: Medicaid Other | Attending: Emergency Medicine | Admitting: Emergency Medicine

## 2023-07-10 DIAGNOSIS — I10 Essential (primary) hypertension: Secondary | ICD-10-CM | POA: Diagnosis not present

## 2023-07-10 DIAGNOSIS — Z79899 Other long term (current) drug therapy: Secondary | ICD-10-CM | POA: Insufficient documentation

## 2023-07-10 DIAGNOSIS — R059 Cough, unspecified: Secondary | ICD-10-CM | POA: Diagnosis present

## 2023-07-10 DIAGNOSIS — J069 Acute upper respiratory infection, unspecified: Secondary | ICD-10-CM | POA: Insufficient documentation

## 2023-07-10 MED ORDER — BENZONATATE 100 MG PO CAPS: 100.0000 mg | ORAL_CAPSULE | Freq: Three times a day (TID) | ORAL | 0 refills | Status: AC | PRN

## 2023-07-10 MED ORDER — PREDNISONE 20 MG PO TABS: 40.0000 mg | ORAL_TABLET | Freq: Every day | ORAL | 0 refills | Status: AC

## 2023-07-10 NOTE — ED Triage Notes (Signed)
Pt c/o sinus pressure, cough, and sneezing x2 days.  Pain score 7/10.  Pt reports taking OTC medication w/o relief.  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

## 2023-07-10 NOTE — Discharge Instructions (Addendum)
As discussed, I suspect your symptoms are likely secondary to upper respiratory viral illness.  Recommend continue use of allergy medicine in the outpatient setting such as Zyrtec/creatinine/Allegra.  Will recommend nasal steroid spray such as Nasacort/Flonase.  May also find over-the-counter Coricidin beneficial as this is a decongestant.  Will send in cough suppressant use as needed called benzonatate.  Will also send in short course of prednisone.  You may continue take Tylenol/Motrin for pain/fever.  Please do not hesitate to return if the worrisome signs and symptoms we discussed become apparent.

## 2023-07-10 NOTE — ED Provider Notes (Signed)
[facility] Provider Note   CSN: [id number] Arrival date & time: 07/10/23  7846     History  Chief Complaint  Patient presents with   Facial Pain   Cough    Shirley Thompson is a 47 y.o. female.   Cough   47 year old female presents emergency department with complaints of cough, sinus pressure, nasal congestion, sneezing.  Symptoms present for the past 2 days.  Has been trying over-the-counter antihistamines with some improvement of symptoms.  Presents emergency department for further assessment/evaluation.  Patient states that she works in [employer] with multiple potential sick exposures.  Denies any chest pain, shortness of breath, abdominal pain, nausea, vomiting.  Past medical history significant for GERD, hypertension, hypercholesterolemia, chronic back pain, GERD, anxiety  Home Medications Prior to Admission medications   Medication Sig Start Date End Date Taking? Authorizing Provider  benzonatate (TESSALON) 100 MG capsule Take 1 capsule (100 mg total) by mouth 3 (three) times daily as needed for cough. 07/10/23  Yes Sherian Maroon A, PA  ibuprofen (ADVIL) 800 MG tablet Take 1 tablet (800 mg total) by mouth every 8 (eight) hours as needed. 08/02/21  Yes Terrilee Files, MD  predniSONE (DELTASONE) 20 MG tablet Take 2 tablets (40 mg total) by mouth daily with breakfast for 5 days. 07/10/23 07/15/23 Yes Sherian Maroon A, PA  Aspirin-Salicylamide-Caffeine [phone] MG TABS Take 1 packet by mouth daily as needed. Once taken as needed for pain Patient not taking: Reported on 07/10/2023    [provider]  cholecalciferol (VITAMIN D3) 25 MCG (1000 UNIT) tablet Take 1,000 Units by mouth daily. Patient not taking: Reported on 07/10/2023    [provider]  cyclobenzaprine (FLEXERIL) 10 MG tablet Take 1 tablet (10 mg total) by mouth 3 (three) times daily as needed for muscle spasms. 09/16/21   Geoffery Lyons, MD   hydrochlorothiazide (HYDRODIURIL) 25 MG tablet Take 25 mg by mouth every morning. Patient not taking: Reported on 07/10/2023    [provider]  ibuprofen (ADVIL) 600 MG tablet Take 1 tablet (600 mg total) by mouth every 6 (six) hours as needed. 10/02/21   Tanda Rockers A, DO  lidocaine (LIDODERM) 5 % Place 1 patch onto the skin daily as needed. Remove & Discard patch within 12 hours or as directed by MD Patient not taking: Reported on 07/10/2023 10/02/21   Tanda Rockers A, DO  losartan (COZAAR) 25 MG tablet Take 25 mg by mouth daily.    [provider]  metoCLOPramide (REGLAN) 10 MG tablet Take 1 tablet (10 mg total) by mouth every 8 (eight) hours as needed for nausea. Patient taking differently: Take 10 mg by mouth every 8 (eight) hours as needed for nausea. taking 04/11/16 04/11/17  Jennye Moccasin, MD  ondansetron (ZOFRAN ODT) 4 MG disintegrating tablet Take 1 tablet (4 mg total) by mouth every 8 (eight) hours as needed for nausea or vomiting. Patient not taking: Reported on 09/07/2021 07/23/14   Samuel Jester, DO  oxyCODONE-acetaminophen (PERCOCET/ROXICET) 5-325 MG tablet Take 1 tablet by mouth every 6 (six) hours as needed for severe pain. Patient not taking: Reported on 07/10/2023 08/02/21   Terrilee Files, MD  pantoprazole (PROTONIX) 40 MG tablet Take 40 mg by mouth 2 (two) times daily. Patient not taking: Reported on 07/10/2023    [provider]  rosuvastatin (CRESTOR) 5 MG tablet Take 5 mg by mouth daily.    [provider]  tiZANidine (ZANAFLEX)  4 MG capsule Take 1 capsule (4 mg total) by mouth 3 (three) times daily as needed for muscle spasms. Do not drink alcohol or drive while taking this medication.  May cause drowsiness. Patient not taking: Reported on 07/10/2023 04/27/23   Particia Nearing, PA-C      Allergies    Amlodipine, Azithromycin, Penicillins, Sulfa antibiotics, Amoxicillin, and Lisinopril    Review of Systems   Review of Systems   Respiratory:  Positive for cough.   All other systems reviewed and are negative.   Physical Exam Updated Vital Signs BP (!) 167/99 (BP Location: Right Arm)   Pulse 89   Temp 98.8 F (37.1 C) (Oral)   Resp 16   Ht 5\' 4"  (1.626 m)   Wt 81.6 kg   SpO2 99%   BMI 30.90 kg/m  Physical Exam Vitals and nursing note reviewed.  Constitutional:      General: She is not in acute distress.    Appearance: She is well-developed.  HENT:     Head: Normocephalic and atraumatic.     Comments: Right maxillary sinus tenderness to percussion.    Right Ear: Tympanic membrane normal.     Left Ear: Tympanic membrane normal.     Nose: Congestion and rhinorrhea present.     Mouth/Throat:     Comments: Mild history of pharyngeal erythema.  Uvula midline right symmetric with phonation.  No sublingual or submandibular swelling.  Tonsils 1+ bilaterally without obvious exudate.  No trismus. Eyes:     Conjunctiva/sclera: Conjunctivae normal.  Cardiovascular:     Rate and Rhythm: Normal rate and regular rhythm.     Heart sounds: No murmur heard. Pulmonary:     Effort: Pulmonary effort is normal. No respiratory distress.     Breath sounds: Normal breath sounds. No wheezing, rhonchi or rales.  Abdominal:     Palpations: Abdomen is soft.     Tenderness: There is no abdominal tenderness.  Musculoskeletal:        General: No swelling.     Cervical back: Neck supple.     Right lower leg: No edema.     Left lower leg: No edema.  Skin:    General: Skin is warm and dry.     Capillary Refill: Capillary refill takes less than 2 seconds.  Neurological:     Mental Status: She is alert.  Psychiatric:        Mood and Affect: Mood normal.     ED Results / Procedures / Treatments   Labs (all labs ordered are listed, but only abnormal results are displayed) Labs Reviewed - No data to display  EKG None  Radiology No results found.  Procedures Procedures    Medications Ordered in ED Medications -  No data to display  ED Course/ Medical Decision Making/ A&P                                 Medical Decision Making Risk Prescription drug management.   This patient presents to the ED for concern of cough, sneeze, nasal congestion, this involves an extensive number of treatment options, and is a complaint that carries with it a high risk of complications and morbidity.  The differential diagnosis includes COVID, RSV, influenza, viral infection, bacterial sinusitis, other   Co morbidities that complicate the patient evaluation  See HPI   Additional history obtained:  Additional history obtained from EMR External records from  outside source obtained and reviewed including hospital records   Lab Tests:  Patient declined viral testing  Imaging Studies ordered:  N/a   Cardiac Monitoring: / EKG:  The patient was maintained on a cardiac monitor.  I personally viewed and interpreted the cardiac monitored which showed an underlying rhythm of: Sinus rhythm   Consultations Obtained:  N/a   Problem List / ED Course / Critical interventions / Medication management  Viral URI Reevaluation of the patient showed that the patient stayed the same I have reviewed the patients home medicines and have made adjustments as needed   Social Determinants of Health:  Tobacco use.  Denies illicit drug use.   Test / Admission - Considered:  Viral URI Vitals signs significant for hypertension blood pressure 167/99. Otherwise within normal range and stable throughout visit. 47 year old female presents emergency department with 2 days of cough, nasal congestion, sneezing.  On exam, patient with evidence of nasal congestion/rhinorrhea as well as some right maxillary tenderness to palpation.  Offered patient viral testing given some concern for COVID/influenza/RSV as he is viruses are currently in the community.  Patient declined.  Given that symptoms have only present for the past 2 days,  will refrain from empiric antibiotics at this time for treatment of sinusitis.  Recommend symptomatic therapy as described in AVS.  Close follow-up with primary care recommended for reevaluation.  Treatment plan discussed length with patient and she acknowledged understanding was agreeable to said plan.  Patient overall well-appearing, afebrile in no acute distress. Worrisome signs and symptoms were discussed with the patient, and the patient acknowledged understanding to return to the ED if noticed. Patient was stable upon discharge.          Final Clinical Impression(s) / ED Diagnoses Final diagnoses:  Viral URI with cough    Rx / DC Orders      Peter Garter, [place] 07/10/23 1610    Loetta Rough, MD 07/10/23 1113

## 2023-09-10 ENCOUNTER — Emergency Department (HOSPITAL_COMMUNITY)
Admission: EM | Admit: 2023-09-10 | Discharge: 2023-09-10 | Discharge status: Against Medical Advice | Payer: Medicaid Other | Source: Home / Self Care

## 2023-10-07 ENCOUNTER — Other Ambulatory Visit: Payer: Self-pay

## 2023-10-07 ENCOUNTER — Encounter (HOSPITAL_COMMUNITY): Payer: Self-pay | Admitting: Emergency Medicine

## 2023-10-07 ENCOUNTER — Emergency Department (HOSPITAL_COMMUNITY)
Admission: EM | Admit: 2023-10-07 | Discharge: 2023-10-07 | Disposition: A | Discharge status: Home / Self Care | Attending: Emergency Medicine | Admitting: Emergency Medicine

## 2023-10-07 DIAGNOSIS — R490 Dysphonia: Secondary | ICD-10-CM | POA: Diagnosis present

## 2023-10-07 DIAGNOSIS — J040 Acute laryngitis: Secondary | ICD-10-CM | POA: Insufficient documentation

## 2023-10-07 LAB — GROUP A STREP BY PCR: Group A Strep by PCR: NOT DETECTED

## 2023-10-07 LAB — RESP PANEL BY RT-PCR (RSV, FLU A&B, COVID)  RVPGX2
Influenza A by PCR: NEGATIVE
Influenza B by PCR: NEGATIVE
Resp Syncytial Virus by PCR: NEGATIVE
SARS Coronavirus 2 by RT PCR: NEGATIVE

## 2023-10-07 NOTE — ED Triage Notes (Signed)
 Pt states she woke up this morning and has "lost her voice". States she "doesn't feel sick or nothing".

## 2023-10-07 NOTE — Discharge Instructions (Signed)
 You were seen in the emergency department after losing your voice this morning.  Your COVID and flu test were negative and your strep test was negative.  This is likely viral.  Drink plenty of cool fluids and you can also try tea with honey.  Tylenol for pain.  Follow-up with your regular doctor.  Return to the emergency department if any worsening or concerning symptoms

## 2023-10-07 NOTE — ED Provider Notes (Signed)
 [facility] Provider Note   CSN: [id number] Arrival date & time: 10/07/23  6578     History  Chief Complaint  Patient presents with   Hoarse    Shirley Thompson is a 48 y.o. female.  She is here with losing her voice since she woke up this morning.  Associated with some runny nose and pain in her left ear.  Had no symptoms at all last night.  She is a smoker.  Low-grade fever here.  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  No nausea vomiting diarrhea.  The history is provided by the patient.  URI Presenting symptoms: ear pain, rhinorrhea and sore throat        Home Medications Prior to Admission medications   Medication Sig Start Date End Date Taking? Authorizing Provider  Aspirin-Salicylamide-Caffeine [phone] MG TABS Take 1 packet by mouth daily as needed. Once taken as needed for pain Patient not taking: Reported on 07/10/2023    [provider]  benzonatate (TESSALON) 100 MG capsule Take 1 capsule (100 mg total) by mouth 3 (three) times daily as needed for cough. 07/10/23   Peter Garter, PA  cholecalciferol (VITAMIN D3) 25 MCG (1000 UNIT) tablet Take 1,000 Units by mouth daily. Patient not taking: Reported on 07/10/2023    [provider]  cyclobenzaprine (FLEXERIL) 10 MG tablet Take 1 tablet (10 mg total) by mouth 3 (three) times daily as needed for muscle spasms. 09/16/21   Geoffery Lyons, MD  hydrochlorothiazide (HYDRODIURIL) 25 MG tablet Take 25 mg by mouth every morning. Patient not taking: Reported on 07/10/2023    [provider]  ibuprofen (ADVIL) 600 MG tablet Take 1 tablet (600 mg total) by mouth every 6 (six) hours as needed. 10/02/21   Sloan Leiter, DO  ibuprofen (ADVIL) 800 MG tablet Take 1 tablet (800 mg total) by mouth every 8 (eight) hours as needed. 08/02/21   Terrilee Files, MD  lidocaine (LIDODERM) 5 % Place 1 patch onto the skin daily as needed. Remove & Discard patch within 12  hours or as directed by MD Patient not taking: Reported on 07/10/2023 10/02/21   Tanda Rockers A, DO  losartan (COZAAR) 25 MG tablet Take 25 mg by mouth daily.    [provider]  metoCLOPramide (REGLAN) 10 MG tablet Take 1 tablet (10 mg total) by mouth every 8 (eight) hours as needed for nausea. Patient taking differently: Take 10 mg by mouth every 8 (eight) hours as needed for nausea. taking 04/11/16 04/11/17  Jennye Moccasin, MD  ondansetron (ZOFRAN ODT) 4 MG disintegrating tablet Take 1 tablet (4 mg total) by mouth every 8 (eight) hours as needed for nausea or vomiting. Patient not taking: Reported on 09/07/2021 07/23/14   Samuel Jester, DO  oxyCODONE-acetaminophen (PERCOCET/ROXICET) 5-325 MG tablet Take 1 tablet by mouth every 6 (six) hours as needed for severe pain. Patient not taking: Reported on 07/10/2023 08/02/21   Terrilee Files, MD  pantoprazole (PROTONIX) 40 MG tablet Take 40 mg by mouth 2 (two) times daily. Patient not taking: Reported on 07/10/2023    [provider]  rosuvastatin (CRESTOR) 5 MG tablet Take 5 mg by mouth daily.    [provider]  tiZANidine (ZANAFLEX) 4 MG capsule Take 1 capsule (4 mg total) by mouth 3 (three) times daily as needed for muscle spasms. Do not drink alcohol or drive while taking this medication.  May cause drowsiness. Patient not  taking: Reported on 07/10/2023 04/27/23   Particia Nearing, PA-C      Allergies    Amlodipine, Azithromycin, Penicillins, Sulfa antibiotics, Amoxicillin, and Lisinopril    Review of Systems   Review of Systems  HENT:  Positive for ear pain, rhinorrhea and sore throat.     Physical Exam Updated Vital Signs BP (!) 164/103 (BP Location: Left Arm)   Pulse 90   Temp 99.1 F (37.3 C) (Oral)   Resp 18   Ht 5\' 4"  (1.626 m)   Wt 79.8 kg   LMP 09/23/2023 (Exact Date)   SpO2 100%   BMI 30.21 kg/m  Physical Exam Vitals and nursing note reviewed.  Constitutional:      General: She is not  in acute distress.    Appearance: Normal appearance. She is well-developed.  HENT:     Head: Normocephalic and atraumatic.     Right Ear: Tympanic membrane and ear canal normal.     Left Ear: Tympanic membrane and ear canal normal.     Nose: Nose normal.     Mouth/Throat:     Mouth: Mucous membranes are moist.     Pharynx: Oropharynx is clear. No oropharyngeal exudate or posterior oropharyngeal erythema.  Eyes:     Conjunctiva/sclera: Conjunctivae normal.  Cardiovascular:     Rate and Rhythm: Normal rate and regular rhythm.     Heart sounds: No murmur heard. Pulmonary:     Effort: Pulmonary effort is normal. No respiratory distress.     Breath sounds: Normal breath sounds.  Abdominal:     Palpations: Abdomen is soft.     Tenderness: There is no abdominal tenderness.  Musculoskeletal:        General: No swelling.     Cervical back: Neck supple.  Skin:    General: Skin is warm and dry.     Capillary Refill: Capillary refill takes less than 2 seconds.  Neurological:     General: No focal deficit present.     Mental Status: She is alert.     Gait: Gait normal.     ED Results / Procedures / Treatments   Labs (all labs ordered are listed, but only abnormal results are displayed) Labs Reviewed  RESP PANEL BY RT-PCR (RSV, FLU A&B, COVID)  [id number]  GROUP A STREP BY PCR    EKG None  Radiology No results found.  Procedures Procedures    Medications Ordered in ED Medications - No data to display  ED Course/ Medical Decision Making/ A&P Clinical Course as of 10/07/23 0825  Wynelle Link Oct 07, 2023  0824 Patient's COVID flu RSV and strep tests are negative.  Reviewed with patient. [MB]    Clinical Course User Index [MB] Terrilee Files, MD                                 Medical Decision Making  This patient complains of hoarse voice; this involves an extensive number of treatment Options and is a complaint that carries with it a high risk of complications  and morbidity. The differential includes laryngitis, strep, foreign body, infection  I ordered, reviewed and interpreted labs, which included COVID flu RSV and strep negative Previous records obtained and reviewed in epic no recent admissions Social determinants considered, increased stress Critical Interventions: None  After the interventions stated above, I reevaluated the patient and found patient to be nontoxic-appearing Admission and further testing considered, no  indications for admission or further workup at this time.  Recommended symptomatic treatment and PCP follow-up.  Return instructions discussed         Final Clinical Impression(s) / ED Diagnoses Final diagnoses:  Laryngitis, acute    Rx / DC Orders ED Discharge Orders     None         Terrilee Files, MD 10/07/23 [phone]
